# Patient Record
Sex: Female | Born: 1985 | Race: Black or African American | Hispanic: No | Marital: Single | Smoking: Current every day smoker
Health system: Southern US, Community
[De-identification: ages and names within clinical notes are randomized; demographics above are authoritative.]

## PROBLEM LIST (undated history)

## (undated) ENCOUNTER — Inpatient Hospital Stay (HOSPITAL_COMMUNITY): Payer: Self-pay

## (undated) DIAGNOSIS — I10 Essential (primary) hypertension: Secondary | ICD-10-CM

## (undated) DIAGNOSIS — O139 Gestational [pregnancy-induced] hypertension without significant proteinuria, unspecified trimester: Secondary | ICD-10-CM

## (undated) HISTORY — PX: NO PAST SURGERIES: SHX2092

---
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
## 2007-03-10 ENCOUNTER — Ambulatory Visit (HOSPITAL_COMMUNITY): Admission: RE | Admit: 2007-03-10 | Discharge: 2007-03-10 | Payer: Self-pay | Admitting: Obstetrics & Gynecology

## 2007-03-10 IMAGING — US US OB COMP +14 WK
1 series · 14 of 28 positions shown · non-contrast
Comparison: none

OBSTETRICAL ULTRASOUND:

 This ultrasound exam was performed in the [HOSPITAL] Ultrasound Department.  The OB US report was generated in the AS system, and faxed to the ordering physician.  This report is also available in [REDACTED] PACS.

[Series 1: us ob comp +14 wk · 0.23mm/px · 14 of 84 slices shown]
[im 4/84]
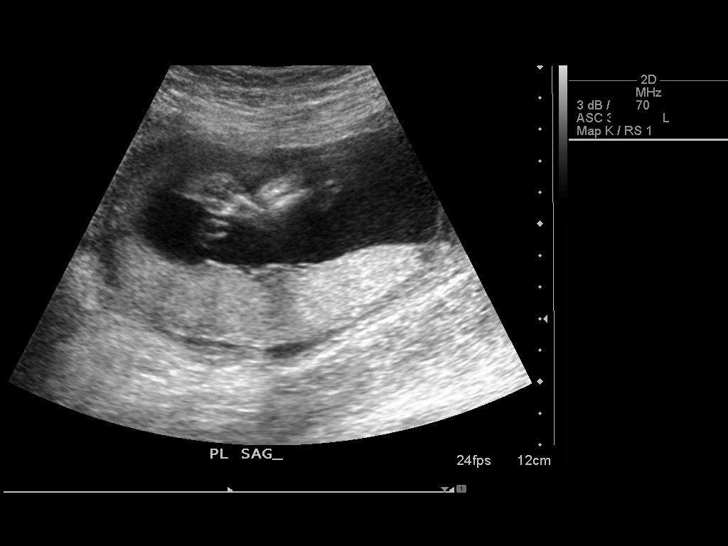
[im 10/84]
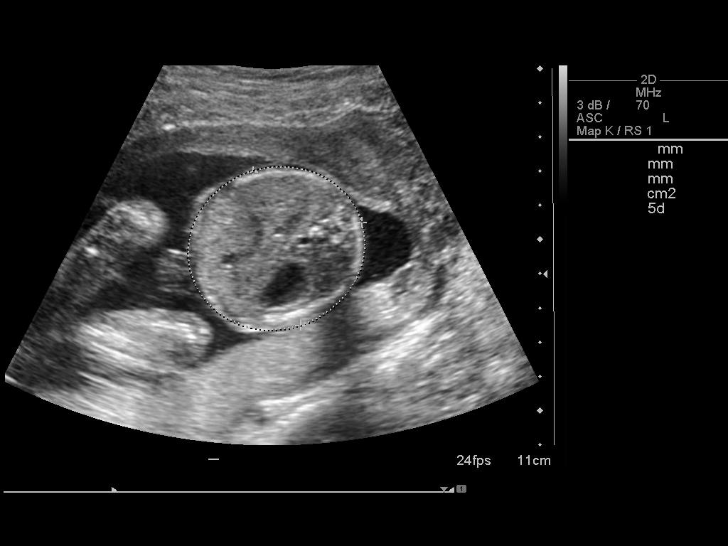
[im 16/84]
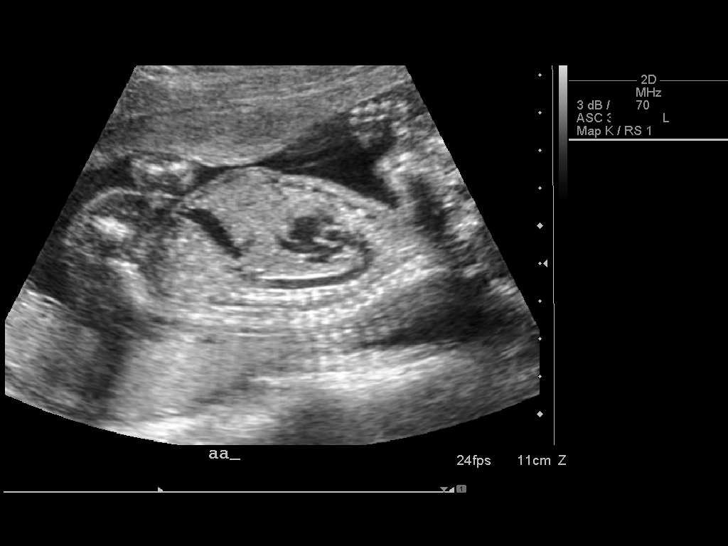
[im 22/84]
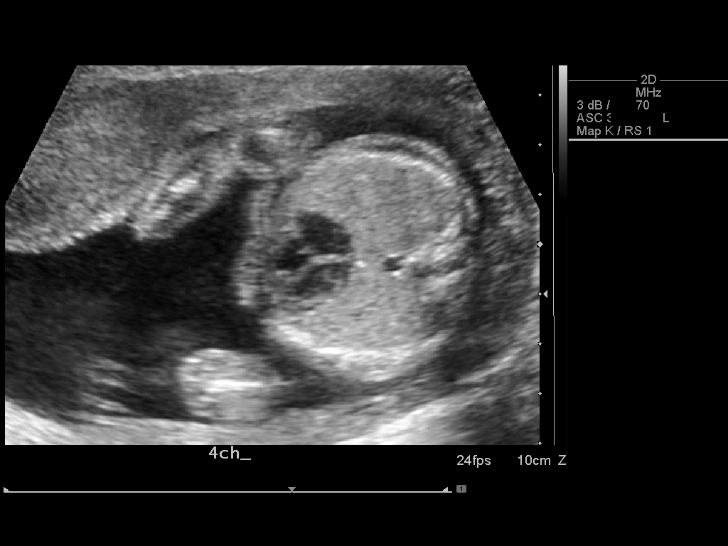
[im 28/84]
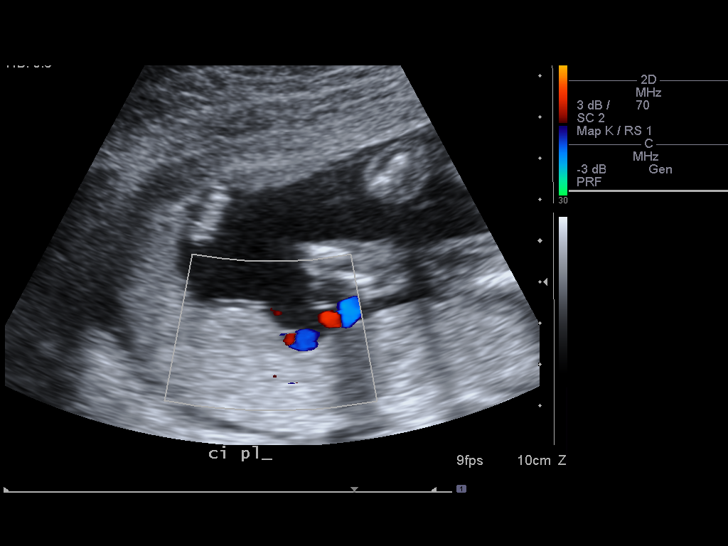
[im 34/84]
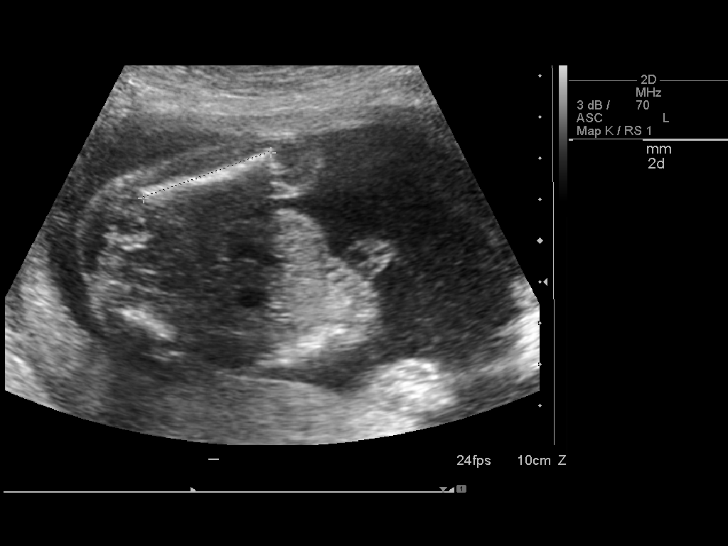
[im 40/84]
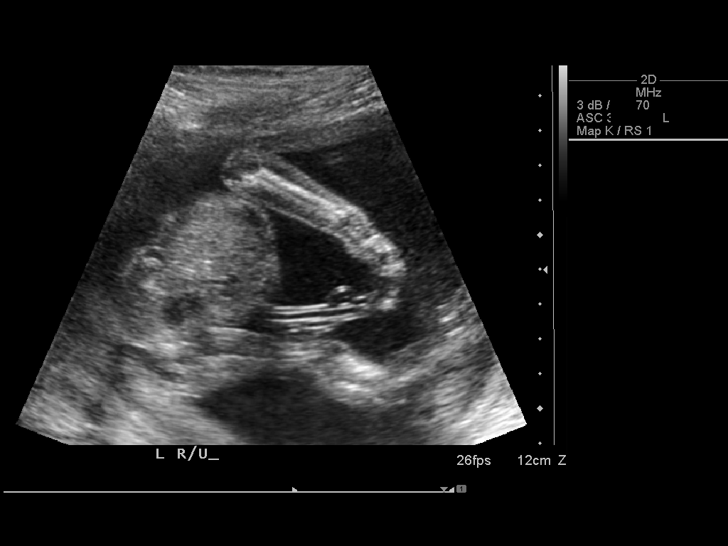
[im 47/84]
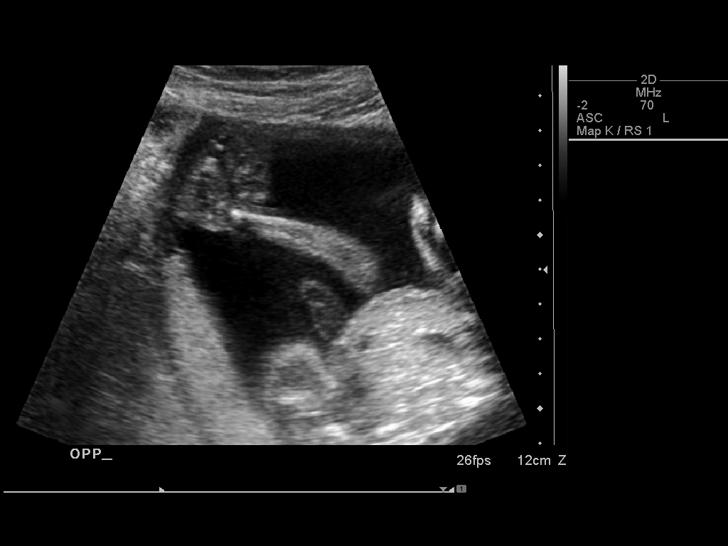
[im 53/84]
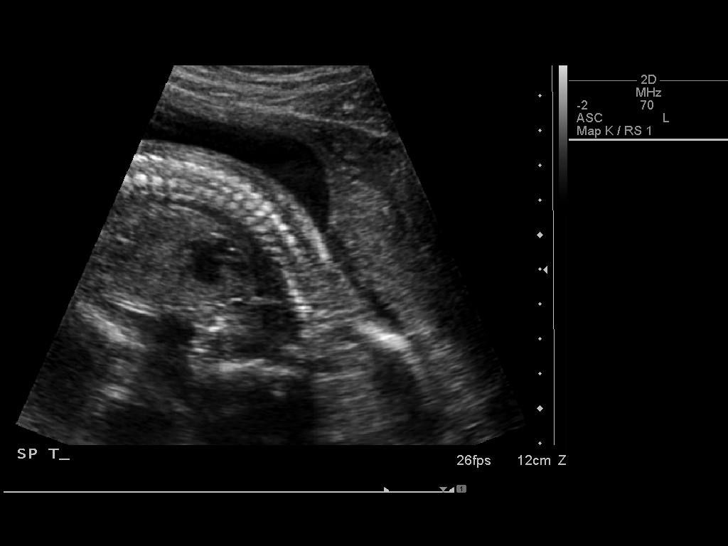
[im 59/84]
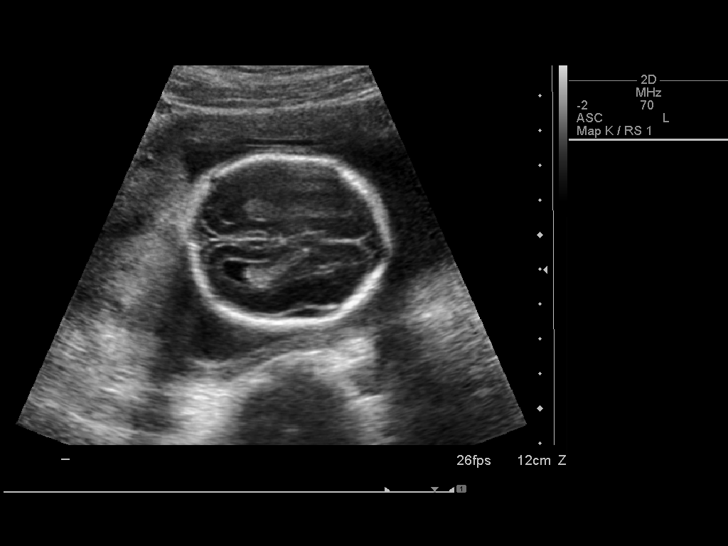
[im 65/84]
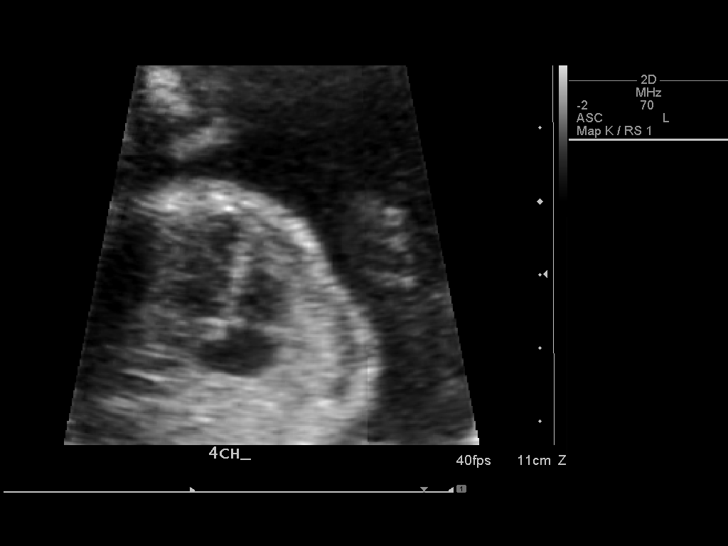
[im 71/84]
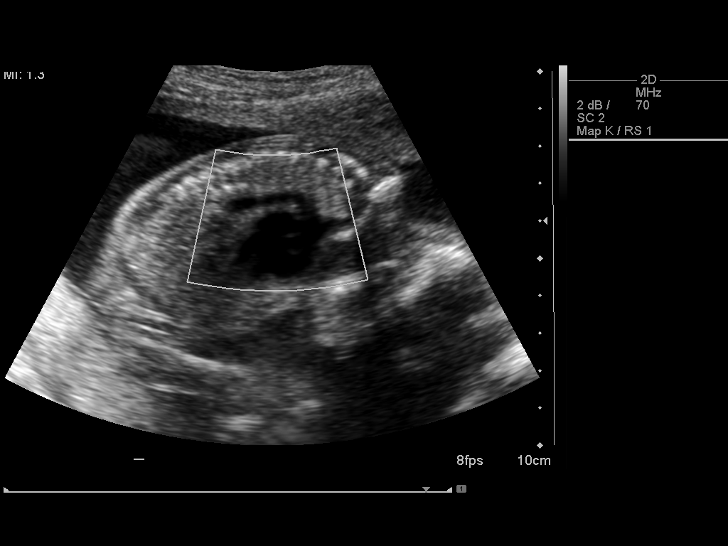
[im 77/84]
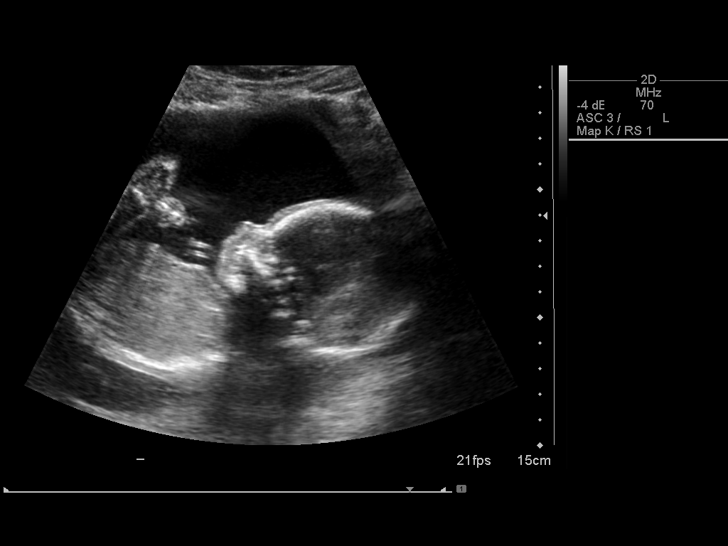
[im 84/84]
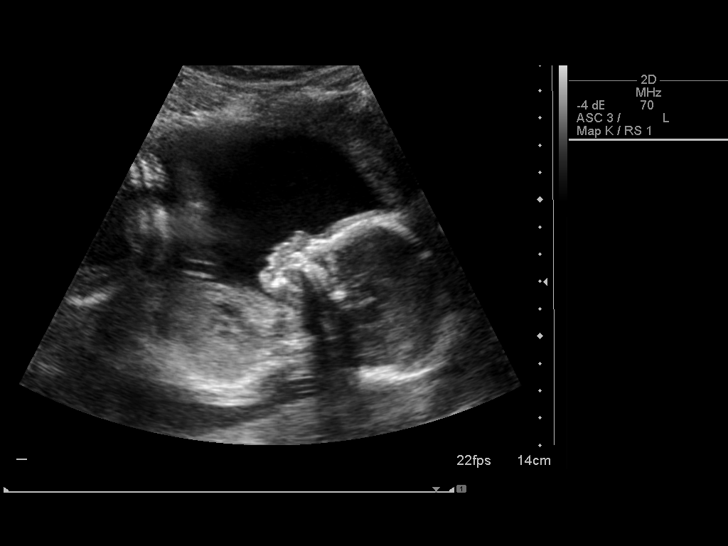

[14 of 28 positions shown; findings below may reference images not displayed]

IMPRESSION: See AS Obstetric US report.

## 2007-05-15 ENCOUNTER — Emergency Department (HOSPITAL_COMMUNITY): Admission: EM | Admit: 2007-05-15 | Discharge: 2007-05-16 | Payer: Self-pay | Admitting: Emergency Medicine

## 2007-06-27 ENCOUNTER — Ambulatory Visit (HOSPITAL_COMMUNITY): Admission: RE | Admit: 2007-06-27 | Discharge: 2007-06-27 | Payer: Self-pay | Admitting: Obstetrics

## 2007-06-27 IMAGING — US US OB FOLLOW-UP
1 series · 14 of 28 positions shown · non-contrast
Comparison: none

OBSTETRICAL ULTRASOUND:
 This ultrasound exam was performed in the [HOSPITAL] Ultrasound Department.  The OB US report was generated in the AS system, and faxed to the ordering physician.  This report is also available in [REDACTED] PACS.

[Series 1: us ob re-eval · 14 of 41 slices shown]
[im 2/41]
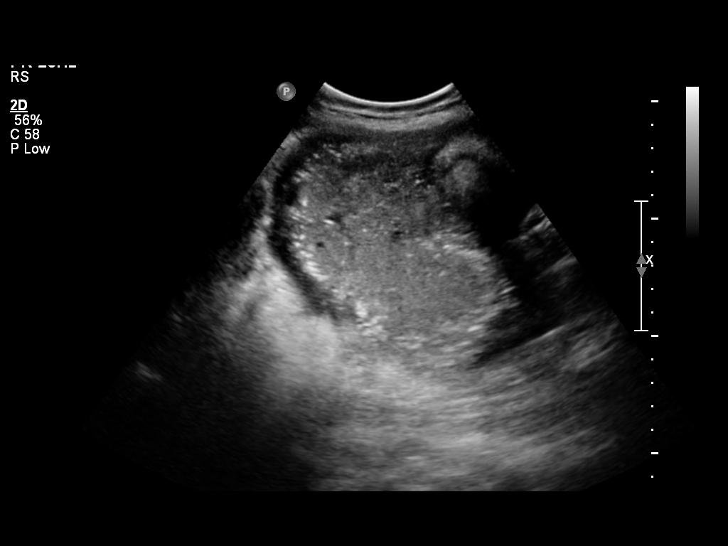
[im 5/41]
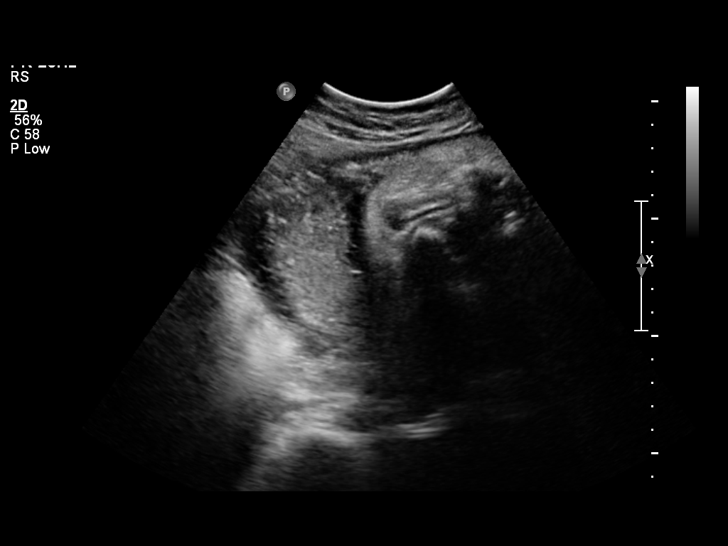
[im 8/41]
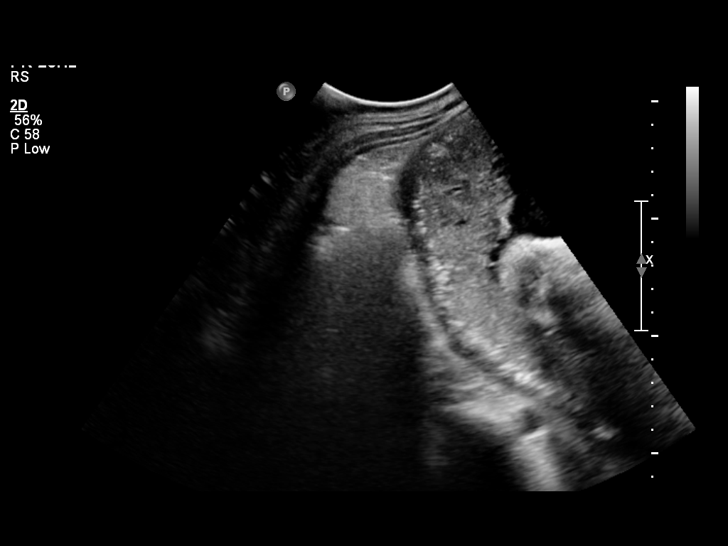
[im 11/41]
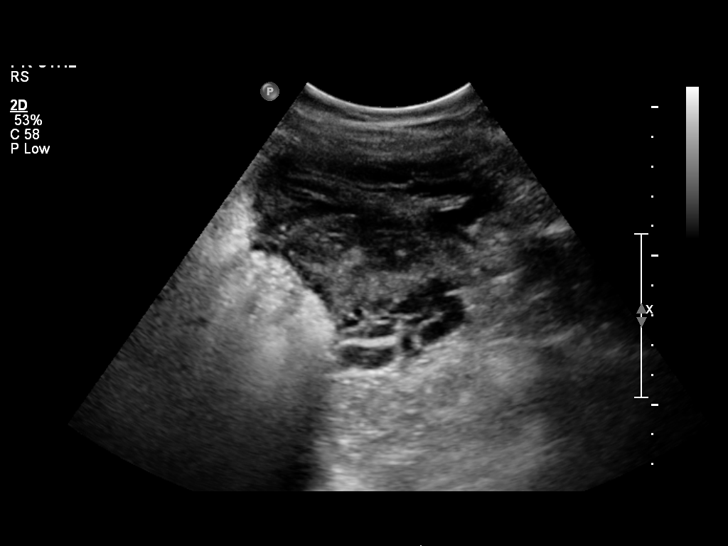
[im 14/41]
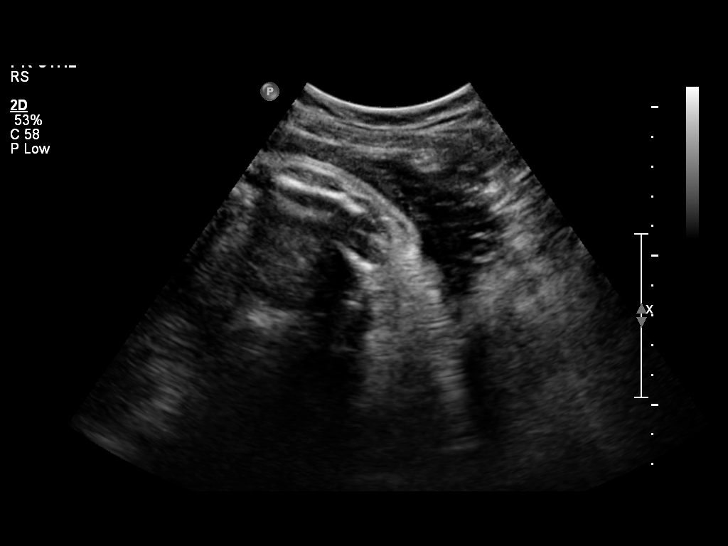
[im 17/41]
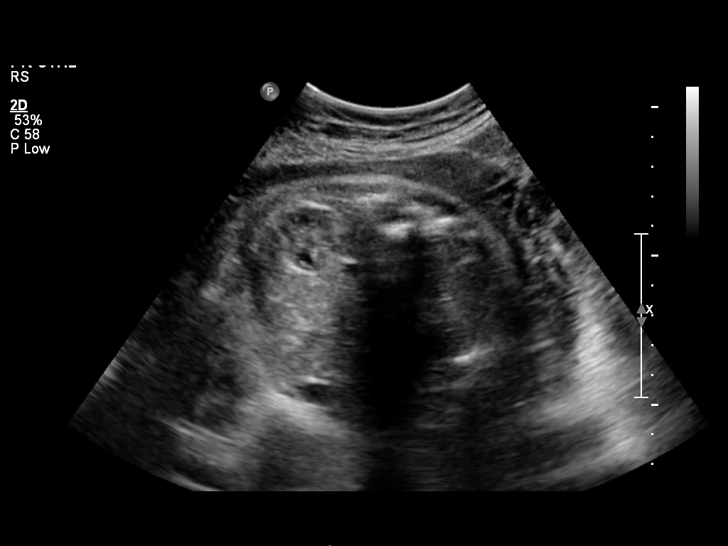
[im 20/41]
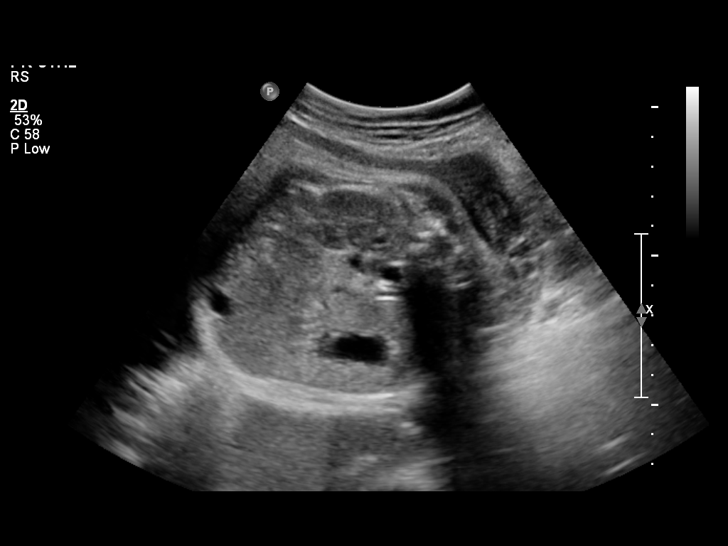
[im 23/41]
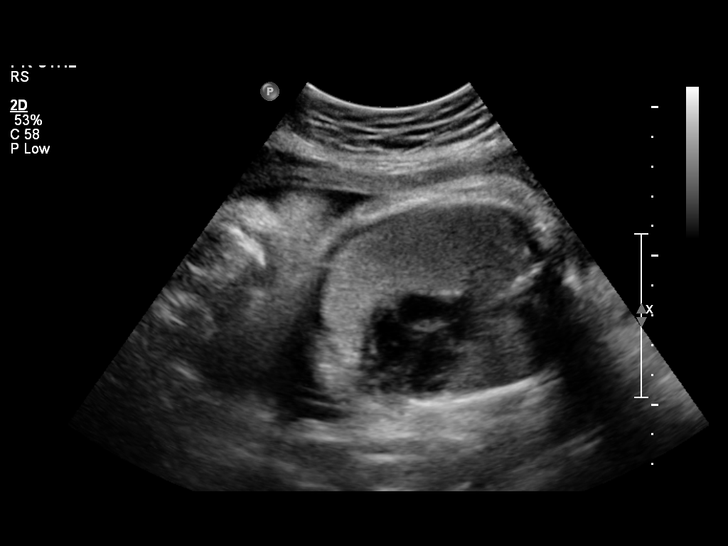
[im 26/41]
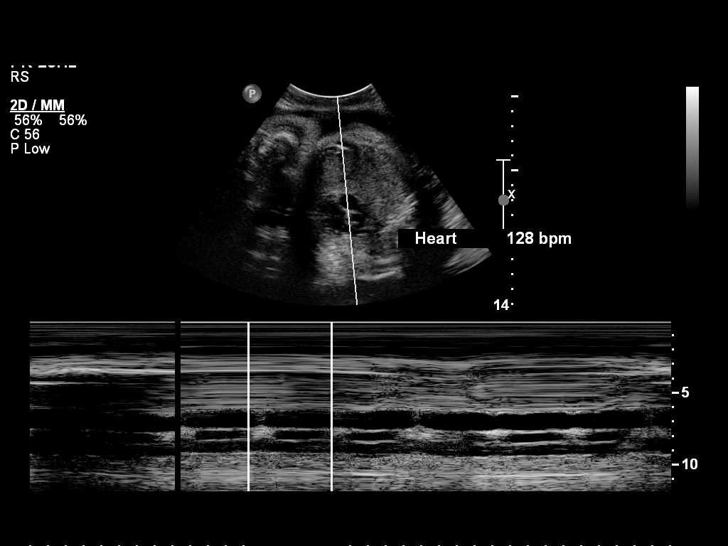
[im 29/41]
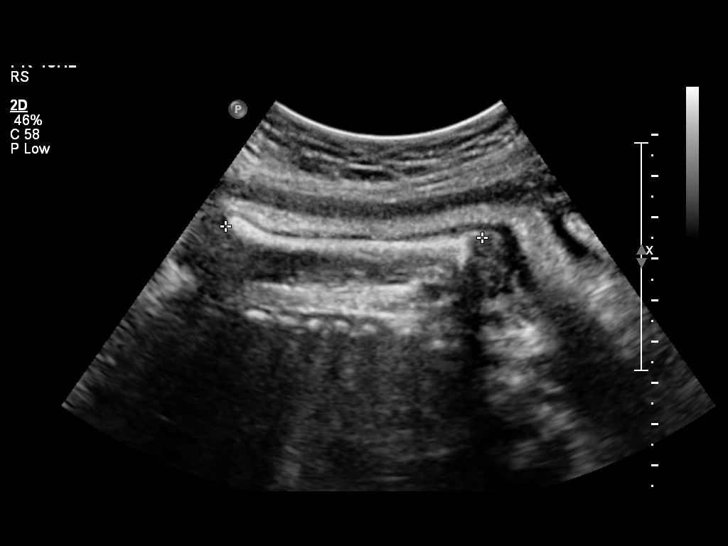
[im 32/41]
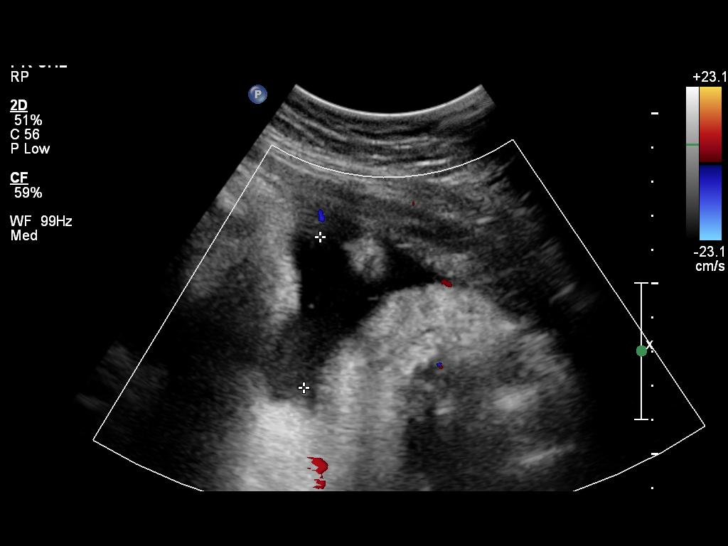
[im 35/41]
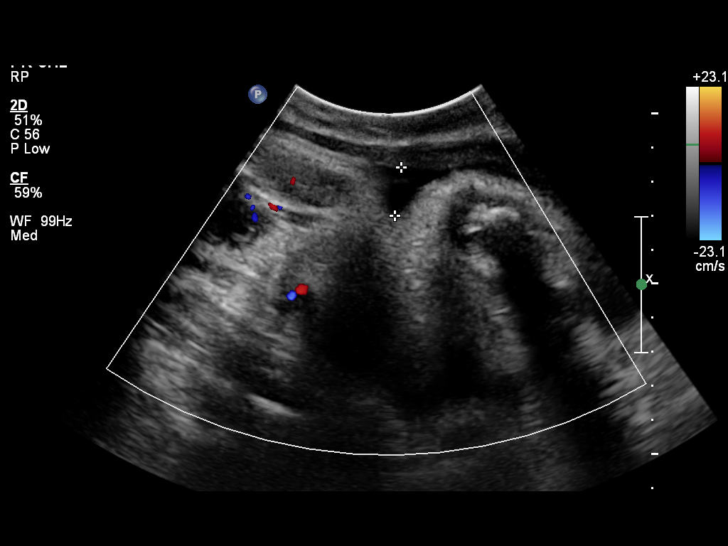
[im 38/41]
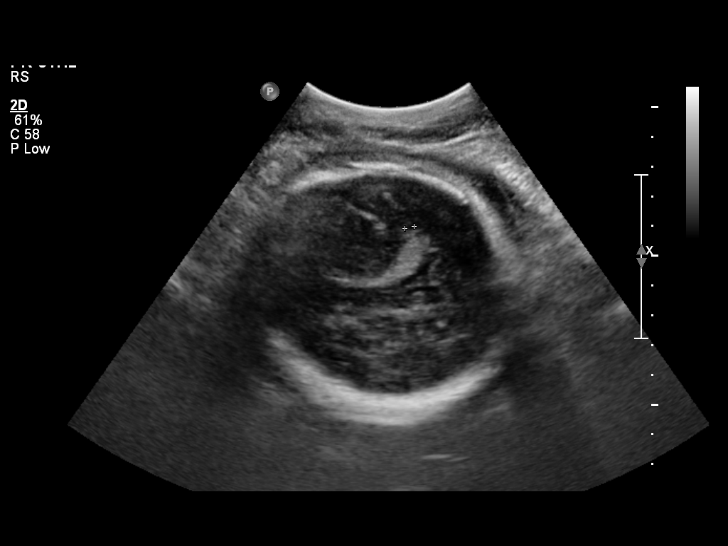
[im 41/41]
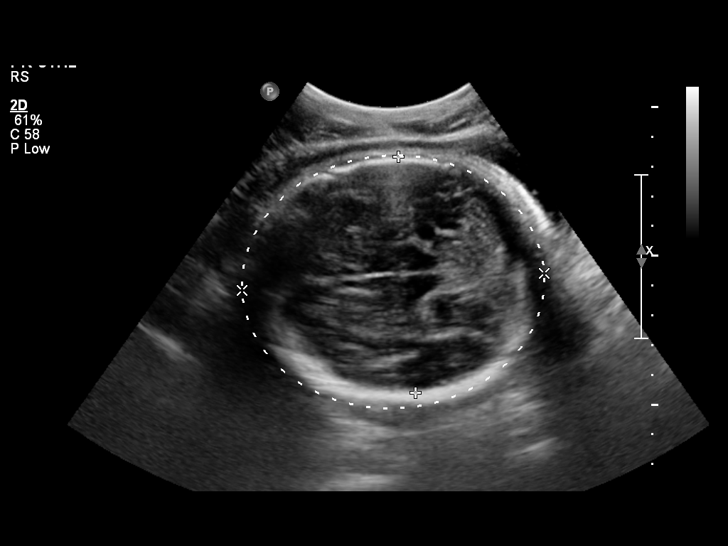

[14 of 28 positions shown; findings below may reference images not displayed]

IMPRESSION: See AS Obstetric US report.

## 2007-07-10 ENCOUNTER — Inpatient Hospital Stay (HOSPITAL_COMMUNITY): Admission: AD | Admit: 2007-07-10 | Discharge: 2007-07-13 | Payer: Self-pay | Admitting: Obstetrics

## 2007-07-11 ENCOUNTER — Encounter (INDEPENDENT_AMBULATORY_CARE_PROVIDER_SITE_OTHER): Payer: Self-pay | Admitting: Obstetrics

## 2008-06-02 ENCOUNTER — Emergency Department (HOSPITAL_COMMUNITY): Admission: EM | Admit: 2008-06-02 | Discharge: 2008-06-02 | Payer: Self-pay | Admitting: Emergency Medicine

## 2009-03-16 ENCOUNTER — Emergency Department (HOSPITAL_COMMUNITY): Admission: EM | Admit: 2009-03-16 | Discharge: 2009-03-16 | Payer: Self-pay | Admitting: Emergency Medicine

## 2009-08-02 ENCOUNTER — Emergency Department (HOSPITAL_COMMUNITY): Admission: EM | Admit: 2009-08-02 | Discharge: 2009-08-02 | Payer: Self-pay | Admitting: Emergency Medicine

## 2009-09-06 ENCOUNTER — Emergency Department (HOSPITAL_COMMUNITY): Admission: EM | Admit: 2009-09-06 | Discharge: 2009-09-06 | Payer: Self-pay | Admitting: Emergency Medicine

## 2010-02-12 ENCOUNTER — Ambulatory Visit (HOSPITAL_COMMUNITY)
Admission: RE | Admit: 2010-02-12 | Discharge: 2010-02-12 | Payer: Self-pay | Source: Home / Self Care | Attending: Obstetrics | Admitting: Obstetrics

## 2010-02-13 ENCOUNTER — Inpatient Hospital Stay (HOSPITAL_COMMUNITY)
Admission: AD | Admit: 2010-02-13 | Discharge: 2010-02-16 | Payer: Self-pay | Source: Home / Self Care | Attending: Obstetrics | Admitting: Obstetrics

## 2010-02-14 ENCOUNTER — Encounter (INDEPENDENT_AMBULATORY_CARE_PROVIDER_SITE_OTHER): Payer: Self-pay | Admitting: Obstetrics

## 2010-02-16 ENCOUNTER — Ambulatory Visit (HOSPITAL_COMMUNITY): Admission: RE | Admit: 2010-02-16 | Payer: Self-pay | Source: Home / Self Care | Admitting: Obstetrics

## 2010-02-16 LAB — COMPREHENSIVE METABOLIC PANEL
ALT: 18 U/L (ref 0–35)
AST: 25 U/L (ref 0–37)
Albumin: 2.8 g/dL — ABNORMAL LOW (ref 3.5–5.2)
Alkaline Phosphatase: 162 U/L — ABNORMAL HIGH (ref 39–117)
BUN: 3 mg/dL — ABNORMAL LOW (ref 6–23)
CO2: 25 mEq/L (ref 19–32)
Calcium: 8.6 mg/dL (ref 8.4–10.5)
Chloride: 105 mEq/L (ref 96–112)
Creatinine, Ser: 0.49 mg/dL (ref 0.4–1.2)
GFR calc Af Amer: >60 mL/min (ref >60)
GFR calc non Af Amer: >60 mL/min (ref >60)
Glucose, Bld: 76 mg/dL (ref 70–99)
Potassium: 2.9 mEq/L — ABNORMAL LOW (ref 3.5–5.1)
Sodium: 137 mEq/L (ref 135–145)
Total Bilirubin: 0.9 mg/dL (ref 0.3–1.2)
Total Protein: 6 g/dL (ref 6.0–8.3)

## 2010-02-16 LAB — URINALYSIS, ROUTINE W REFLEX MICROSCOPIC
Bilirubin Urine: NEGATIVE
Ketones, ur: NEGATIVE mg/dL
Leukocytes, UA: NEGATIVE
Nitrite: NEGATIVE
Protein, ur: 30 mg/dL — AB
Specific Gravity, Urine: 1.01 (ref 1.005–1.030)
Urine Glucose, Fasting: NEGATIVE mg/dL
Urobilinogen, UA: 0.2 mg/dL (ref 0.0–1.0)
pH: 7 (ref 5.0–8.0)

## 2010-02-16 LAB — URINE MICROSCOPIC-ADD ON

## 2010-02-16 LAB — CBC
HCT: 31.2 % — ABNORMAL LOW (ref 36.0–46.0)
Hemoglobin: 10.7 g/dL — ABNORMAL LOW (ref 12.0–15.0)
MCH: 29.8 pg (ref 26.0–34.0)
MCHC: 34.3 g/dL (ref 30.0–36.0)
MCV: 86.9 fL (ref 78.0–100.0)
Platelets: 195 10*3/uL (ref 150–400)
RBC: 3.59 MIL/uL — ABNORMAL LOW (ref 3.87–5.11)
RDW: 13.7 % (ref 11.5–15.5)
WBC: 7.6 10*3/uL (ref 4.0–10.5)

## 2010-02-16 LAB — URIC ACID: Uric Acid, Serum: 4.3 mg/dL (ref 2.4–7.0)

## 2010-02-16 LAB — LACTATE DEHYDROGENASE: LDH: 168 U/L (ref 94–250)

## 2010-02-17 LAB — CBC
HCT: 34.8 % — ABNORMAL LOW (ref 36.0–46.0)
HCT: 31.2 % — ABNORMAL LOW (ref 36.0–46.0)
Hemoglobin: 11.9 g/dL — ABNORMAL LOW (ref 12.0–15.0)
Hemoglobin: 10.7 g/dL — ABNORMAL LOW (ref 12.0–15.0)
MCH: 29.8 pg (ref 26.0–34.0)
MCH: 30.1 pg (ref 26.0–34.0)
MCHC: 34.2 g/dL (ref 30.0–36.0)
MCHC: 34.3 g/dL (ref 30.0–36.0)
MCV: 87 fL (ref 78.0–100.0)
MCV: 87.6 fL (ref 78.0–100.0)
Platelets: 225 10*3/uL (ref 150–400)
Platelets: 205 10*3/uL (ref 150–400)
RBC: 4 MIL/uL (ref 3.87–5.11)
RBC: 3.56 MIL/uL — ABNORMAL LOW (ref 3.87–5.11)
RDW: 13.7 % (ref 11.5–15.5)
RDW: 14.2 % (ref 11.5–15.5)
WBC: 12 10*3/uL — ABNORMAL HIGH (ref 4.0–10.5)
WBC: 11.9 10*3/uL — ABNORMAL HIGH (ref 4.0–10.5)

## 2010-02-17 LAB — MRSA PCR SCREENING: MRSA by PCR: NEGATIVE

## 2010-02-17 LAB — RPR: RPR Ser Ql: NONREACTIVE

## 2010-02-18 NOTE — H&P (Addendum)
  NAMERAISSA, DAM NO.:  [id number]  MEDICAL RECORD NO.:  [id number]          PATIENT TYPE:  INP  LOCATION:  9374                          FACILITY:  [facility]  PHYSICIAN:  Roseanna Rainbow, M.D.DATE OF BIRTH:  [birth year]  DATE OF ADMISSION:  02/13/2010 DATE OF DISCHARGE:                             HISTORY & PHYSICAL   CHIEF COMPLAINT:  The patient is a 25 year old para 1, with an estimated date of confinement of February 21, 2010, with a headache and elevated blood pressures.  HISTORY OF PRESENT ILLNESS:  [facility]  One day prior to presentation, she reports undergoing a nonstress test.  Blood pressures at that point per the patient were in the 130s/90s range.  The patient has been monitoring her blood pressures at home and diastolics have been over 100s.  She also complains of a headache, severity 8/10.  ALLERGIES:  No known drug allergies.  PAST GYN HISTORY:  Normal triad.  PAST OB HISTORY:  She was delivered of a live born female in 2009, birth weight 5 pounds and 6 ounces at 38 weeks, labor was induced for pregnancy-induced hypertension.  PAST MEDICAL HISTORY:  Asthma.  PAST SURGICAL HISTORY:  She denies.  FAMILY HISTORY:  Noncontributory.  SOCIAL HISTORY:  No tobacco, ethanol, or drug use.  ANTEPARTUM COURSE:  Prenatal care with Dr. Gaynell Face, onset of care at 14- 16 weeks.  PRENATAL LABS:  Hemoglobin 13.1, hematocrit 38.4, platelets 331,000. Blood type is O positive, antibody screen negative, sickle cell trait negative, RPR nonreactive, rubella immune, hepatitis B surface antigen negative, HIV nonreactive, 1-hour GTT 135, GBS negative on January 12, 2010.  Ultrasound on September 12, 2009 at 16 weeks and 6 days dates of pregnancy, [facility] February 21, 2010, anterior placenta, no previa.  REVIEW OF SYMPTOMS:  NEUROLOGIC:  Please see the above.  MEDICATIONS:  Please see the medication reconciliation  form.  PHYSICAL EXAMINATION:  VITAL SIGNS:  Blood pressure 140s to 160s over 80s to 110s. GENERAL:  Moderate distress. ABDOMEN:  Fetal heart tracing baseline 120s to 130s, good long-term variability, accelerations noted, no decelerations.  Tocodynamometer, rare uterine contractions. GU:  Sterile vaginal exam per the mid-level provider.  LABS:  Uric acid 4.3.  LDH 168.  CMET normal.  Urinalysis, trace protein.  Hemoglobin 10.7, platelets 195,000.  ASSESSMENT: 1. Intrauterine pregnancy at 38 plus weeks. 2. Pregnancy-induced hypertension by blood pressure criteria and     neurologic symptoms. 3. Category 1 fetal heart tracing. 4. Unfavorable Bishop score.  PLAN:  Admission, 2-stage induction of labor, magnesium sulfate for seizure prophylaxis.  We are going to add labetalol as needed, may need to start oral antihypertensive agents.     Roseanna Rainbow, M.D.     Judee Clara  D:  02/14/2010  T:  02/14/2010  Job:  [id number]  cc:   Kathreen Cosier, M.D. Fax: [phone]  Electronically Signed by Antionette Char M.D. on 02/18/2010 09:18:07 PM

## 2010-02-25 ENCOUNTER — Encounter: Payer: Self-pay | Admitting: Obstetrics

## 2010-03-23 ENCOUNTER — Emergency Department (HOSPITAL_COMMUNITY)
Admission: EM | Admit: 2010-03-23 | Discharge: 2010-03-24 | Discharge status: Against Medical Advice | Payer: Medicaid Other | Attending: Emergency Medicine | Admitting: Emergency Medicine

## 2010-03-23 DIAGNOSIS — R51 Headache: Secondary | ICD-10-CM | POA: Insufficient documentation

## 2010-04-10 LAB — RAPID STREP SCREEN (MED CTR MEBANE ONLY): Streptococcus, Group A Screen (Direct): NEGATIVE

## 2010-10-22 LAB — COMPREHENSIVE METABOLIC PANEL
ALT: 10
ALT: 13
AST: 17
AST: 20
Albumin: 2.1 — ABNORMAL LOW
Albumin: 2.9 — ABNORMAL LOW
Alkaline Phosphatase: 101
Alkaline Phosphatase: 154 — ABNORMAL HIGH
BUN: 2 — ABNORMAL LOW
BUN: 3 — ABNORMAL LOW
CO2: 23
CO2: 27
Calcium: 7.3 — ABNORMAL LOW
Calcium: 8.8
Chloride: 103
Chloride: 104
Creatinine, Ser: 0.39 — ABNORMAL LOW
Creatinine, Ser: 0.54
GFR calc Af Amer: >60
GFR calc Af Amer: >60
GFR calc non Af Amer: >60
GFR calc non Af Amer: >60
Glucose, Bld: 67 — ABNORMAL LOW
Glucose, Bld: 94
Potassium: 2.7 — CL
Potassium: 3.5
Sodium: 135
Sodium: 137
Total Bilirubin: 0.8
Total Bilirubin: 1.1
Total Protein: 5.1 — ABNORMAL LOW
Total Protein: 6.2

## 2010-10-22 LAB — CBC
HCT: 32 — ABNORMAL LOW
HCT: 37.4
Hemoglobin: 11 — ABNORMAL LOW
Hemoglobin: 13
MCHC: 34.3
MCHC: 34.7
MCV: 92.7
MCV: 94
Platelets: 218
Platelets: 249
RBC: 3.41 — ABNORMAL LOW
RBC: 4.03
RDW: 13.6
RDW: 13.6
WBC: 13.7 — ABNORMAL HIGH
WBC: 7.5

## 2010-10-22 LAB — RPR: RPR Ser Ql: NONREACTIVE

## 2010-10-22 LAB — LACTATE DEHYDROGENASE
LDH: 158
LDH: 184

## 2010-10-22 LAB — MAGNESIUM: Magnesium: 4.6 — ABNORMAL HIGH

## 2010-10-22 LAB — URIC ACID: Uric Acid, Serum: 4.6

## 2011-01-26 ENCOUNTER — Emergency Department (INDEPENDENT_AMBULATORY_CARE_PROVIDER_SITE_OTHER)
Admission: EM | Admit: 2011-01-26 | Discharge: 2011-01-26 | Disposition: A | Discharge status: Home / Self Care | Payer: PRIVATE HEALTH INSURANCE | Source: Home / Self Care | Attending: Emergency Medicine | Admitting: Emergency Medicine

## 2011-01-26 ENCOUNTER — Encounter: Payer: Self-pay | Admitting: *Deleted

## 2011-01-26 DIAGNOSIS — L0291 Cutaneous abscess, unspecified: Secondary | ICD-10-CM

## 2011-01-26 DIAGNOSIS — L0390 Cellulitis, unspecified: Secondary | ICD-10-CM

## 2011-01-26 HISTORY — DX: Essential (primary) hypertension: I10

## 2011-01-26 MED ORDER — SULFAMETHOXAZOLE-TMP DS 800-160 MG PO TABS: 2.0000 | ORAL_TABLET | Freq: Two times a day (BID) | ORAL | Status: AC

## 2011-01-26 NOTE — ED Provider Notes (Signed)
History     CSN: [id number]  Arrival date & time 01/26/11  0930   First MD Initiated Contact with Patient 01/26/11 1007      Chief Complaint  Patient presents with  . Sore    (Consider location/radiation/quality/duration/timing/severity/associated sxs/prior treatment) HPI Comments: Rachel Patel has had a two-day history of a painful bump on her left labia majora. This has not been draining. She denies any fever or chills. She has not had any other abscesses or skin infections and denies any other skin lesions elsewhere and she has no history of MRSA or diabetes.   Past Medical History  Diagnosis Date  . Asthma     during pregnancy  . Hypertension     during pregnancy    History reviewed. No pertinent past surgical history.  History reviewed. No pertinent family history.  History  Substance Use Topics  . Smoking status: Current Everyday Smoker  . Smokeless tobacco: Not on file  . Alcohol Use: No    OB History    Grav Para Term Preterm Abortions TAB SAB Ect Mult Living                  Review of Systems  Constitutional: Negative for fever and chills.  Skin: Negative for color change, pallor, rash and wound.    Allergies  Review of patient's allergies indicates no known allergies.  Home Medications   Current Outpatient Rx  Name Route Sig Dispense Refill  . SULFAMETHOXAZOLE-TMP DS 800-160 MG PO TABS Oral Take 2 tablets by mouth 2 (two) times daily. 40 tablet 0    BP 122/76  Pulse 78  Temp(Src) 98.2 F (36.8 C) (Oral)  Resp 16  SpO2 100%  LMP 01/25/2011  Physical Exam  Nursing note and vitals reviewed. Constitutional: She appears well-developed and well-nourished. No distress.  Genitourinary:       On pelvic exam, she has a 1 cm, swollen, tender, fluctuant nodule at the posterior and of the left labia majora. I did not attempt to do a speculum or bimanual examination due to the patient's discomfort.  Skin: Skin is warm and dry. No abrasion, no bruising, no  ecchymosis, no lesion and no rash noted. She is not diaphoretic. No erythema. No pallor.    ED Course  INCISION AND DRAINAGE Date/Time: 01/26/2011 5:08 PM Performed by: Roque Lias Authorized by: Roque Lias Consent: Verbal consent obtained. Written consent not obtained. Risks and benefits: risks, benefits and alternatives were discussed Consent given by: patient Patient understanding: patient states understanding of the procedure being performed Patient identity confirmed: verbally with patient and arm band Type: abscess Body area: anogenital Location details: vulva Anesthesia: local infiltration Local anesthetic: lidocaine 2% with epinephrine Anesthetic total: 2 ml Patient sedated: no Scalpel size: 11 Incision type: single straight Complexity: simple Drainage: purulent Drainage amount: moderate Wound treatment: drain placed Packing material: 1/4 in iodoform gauze Patient tolerance: Patient tolerated the procedure well with no immediate complications.   (including critical care time)   Labs Reviewed  CULTURE, ROUTINE-ABSCESS   No results found.   1. Abscess       MDM  She has an abscess of the left labia majora. This was cultured. She was begun on Septra DS, 2 tablets twice daily for 10 days. She was given instructions on care of the area at home.        Roque Lias, MD 01/26/11 1710

## 2011-01-26 NOTE — ED Notes (Signed)
Pt is here with complaints of suspected vaginal abcess.  Pt reports large, hard lump on left labia.  Onset yesterday.

## 2011-01-29 LAB — CULTURE, ROUTINE-ABSCESS

## 2011-09-30 ENCOUNTER — Encounter (HOSPITAL_COMMUNITY): Payer: Self-pay | Admitting: Emergency Medicine

## 2011-09-30 DIAGNOSIS — Z885 Allergy status to narcotic agent status: Secondary | ICD-10-CM | POA: Insufficient documentation

## 2011-09-30 DIAGNOSIS — O99330 Smoking (tobacco) complicating pregnancy, unspecified trimester: Secondary | ICD-10-CM | POA: Insufficient documentation

## 2011-09-30 DIAGNOSIS — M259 Joint disorder, unspecified: Secondary | ICD-10-CM | POA: Insufficient documentation

## 2011-09-30 DIAGNOSIS — M549 Dorsalgia, unspecified: Secondary | ICD-10-CM | POA: Insufficient documentation

## 2011-09-30 DIAGNOSIS — M899 Disorder of bone, unspecified: Secondary | ICD-10-CM | POA: Insufficient documentation

## 2011-09-30 NOTE — ED Notes (Signed)
Pt reports having lower back pain, hip pain, cramping in abd--all started tonight; pt reports she is pregnant but unsure how far along, approx. 4 months; pt does report feeling baby moving; denies vaginal bleeding; took tylenol with no relief

## 2011-10-01 ENCOUNTER — Emergency Department (HOSPITAL_COMMUNITY)
Admission: EM | Admit: 2011-10-01 | Discharge: 2011-10-01 | Disposition: A | Discharge status: Home / Self Care | Payer: Medicaid Other | Attending: Emergency Medicine | Admitting: Emergency Medicine

## 2011-10-01 DIAGNOSIS — M549 Dorsalgia, unspecified: Secondary | ICD-10-CM

## 2011-10-01 LAB — POCT PREGNANCY, URINE: Preg Test, Ur: POSITIVE — AB

## 2011-10-01 LAB — URINALYSIS, ROUTINE W REFLEX MICROSCOPIC
Leukocytes, UA: NEGATIVE
Protein, ur: NEGATIVE mg/dL
Urobilinogen, UA: 0.2 mg/dL (ref 0.0–1.0)

## 2011-10-01 MED ORDER — PRENATAL 19 PO TABS: 1.0000 | ORAL_TABLET | Freq: Every day | ORAL | Status: DC

## 2011-10-01 MED ORDER — ACETAMINOPHEN 325 MG PO TABS
650.0000 mg | ORAL_TABLET | Freq: Once | ORAL | Status: AC
  Administered 2011-10-01: 650 mg via ORAL
  Filled 2011-10-01: qty 2

## 2011-10-01 NOTE — ED Notes (Signed)
Patient was given discharge instructions and released from the ed in stable condition.

## 2011-10-01 NOTE — ED Notes (Signed)
[place] at bedside per Dr. Juliette Mangle request

## 2011-10-01 NOTE — ED Provider Notes (Signed)
History     CSN: [id number]  Arrival date & time 09/30/11  2339   First MD Initiated Contact with Patient 10/01/11 0133      Chief Complaint  Patient presents with  . Abdominal Cramping    (Consider location/radiation/quality/duration/timing/severity/associated sxs/prior treatment) HPI LMP MAy.  G3P2, pos preg test at home. Not scheduled to see OB GYN until she gets medicaid card which is pending. Has not started PNVs, having lower back pains, works on her feet all day, no dysuria, urgency or frequency. No vag bleeding or LOF, is feeling baby movng. Prior preg complicated by pre-eclampsia. No F/C, no N/V. Pain mild in severity and currently not pregnant, no trauma, no radiation of pain, no LE weakness or numbness. Location lower back/ has not taken anything for this.   Past Medical History  Diagnosis Date  . Asthma     during pregnancy  . Hypertension     during pregnancy    History reviewed. No pertinent past surgical history.  History reviewed. No pertinent family history.  History  Substance Use Topics  . Smoking status: Current Everyday Smoker -- 0.2 packs/day  . Smokeless tobacco: Not on file  . Alcohol Use: No    OB History    Grav Para Term Preterm Abortions TAB SAB Ect Mult Living   1               Review of Systems  Constitutional: Negative for fever and chills.  HENT: Negative for neck pain and neck stiffness.   Eyes: Negative for pain.  Respiratory: Negative for shortness of breath.   Cardiovascular: Negative for chest pain.  Gastrointestinal: Negative for abdominal pain.  Genitourinary: Negative for dysuria.  Musculoskeletal: Positive for back pain.  Skin: Negative for rash.  Neurological: Negative for headaches.  All other systems reviewed and are negative.    Allergies  Codeine  Home Medications   Current Outpatient Rx  Name Route Sig Dispense Refill  . ACETAMINOPHEN 500 MG PO TABS Oral Take 1,000 mg by mouth every 6 (six) hours as needed.  For pain      BP 102/58  Pulse 81  Temp 98.8 F (37.1 C) (Oral)  Resp 18  SpO2 100%  LMP 06/14/2011  Physical Exam  Constitutional: She is oriented to person, place, and time. She appears well-developed and well-nourished.  HENT:  Head: Normocephalic and atraumatic.  Eyes: Conjunctivae and EOM are normal. Pupils are equal, round, and reactive to light.  Neck: Trachea normal. Neck supple. No thyromegaly present.  Cardiovascular: Normal rate, regular rhythm, S1 normal, S2 normal and normal pulses.     No systolic murmur is present   No diastolic murmur is present  Pulses:      Radial pulses are 2+ on the right side, and 2+ on the left side.  Pulmonary/Chest: Effort normal and breath sounds normal. She has no wheezes. She has no rhonchi. She has no rales. She exhibits no tenderness.  Abdominal: Soft. Normal appearance and bowel sounds are normal. There is no rebound, no guarding, no CVA tenderness and negative Murphy's sign.       gravid  Musculoskeletal:       Mild lumbar and paralumbar tenderness no deformity. BLE:s no deficits, Calves nontender, no cords or erythema, negative Homans sign  Neurological: She is alert and oriented to person, place, and time. She has normal strength. No cranial nerve deficit or sensory deficit. GCS eye subscore is 4. GCS verbal subscore is 5. GCS motor subscore  is 6.  Skin: Skin is warm and dry. No rash noted. She is not diaphoretic.  Psychiatric: Her speech is normal.       Cooperative and appropriate    ED Course  [place] bedside Date/Time: 10/01/2011 2:17 AM Performed by: Sunnie Nielsen Authorized by: Sunnie Nielsen Consent: Verbal consent obtained. Risks and benefits: risks, benefits and alternatives were discussed Consent given by: patient Patient understanding: patient states understanding of the procedure being performed Patient consent: the patient's understanding of the procedure matches consent given Procedure consent: procedure consent matches  procedure scheduled Required items: required blood products, implants, devices, and special equipment available Patient identity confirmed: verbally with patient Time out: Immediately prior to procedure a "time out" was called to verify the correct patient, procedure, equipment, support staff and site/side marked as required. Preparation: Patient was prepped and draped in the usual sterile fashion. Patient tolerance: Patient tolerated the procedure well with no immediate complications. Comments: Transabdominal US demonstrates IUP with BPD [redacted]w[redacted]d and FHR 167   (including critical care time)  Results for orders placed during the hospital encounter of 10/01/11  URINALYSIS, ROUTINE W REFLEX MICROSCOPIC      Component Value Range   Color, Urine YELLOW  YELLOW   APPearance CLOUDY (*) CLEAR   Specific Gravity, Urine 1.040 (*) 1.005 - 1.030   pH 6.0  5.0 - 8.0   Glucose, UA NEGATIVE  NEGATIVE mg/dL   Hgb urine dipstick NEGATIVE  NEGATIVE   Bilirubin Urine NEGATIVE  NEGATIVE   Ketones, ur NEGATIVE  NEGATIVE mg/dL   Protein, ur NEGATIVE  NEGATIVE mg/dL   Urobilinogen, UA 0.2  0.0 - 1.0 mg/dL   Nitrite NEGATIVE  NEGATIVE   Leukocytes, UA NEGATIVE  NEGATIVE  POCT PREGNANCY, URINE      Component Value Range   Preg Test, Ur POSITIVE (*) NEGATIVE   Ice, tylenol  RX PNVs, PT plans to see Dr Gaynell Face who she saw in the past. Stable for d/c home/. precautions verbalized as understood.   UA reviewed no UTI.  MDM   VS and nursing notes reviewed. Medication provided. [place] as above. Plan f/u OB GYN and start PNVs, tylenol and ice and rest for back pain.      Sunnie Nielsen, MD 10/01/11 [phone]

## 2011-10-01 NOTE — ED Notes (Signed)
Patient reports being about [redacted] weeks pregnant. No reports of abnormal vaginal discharge. Reports right flank pain started while at work at [employer].

## 2011-10-16 ENCOUNTER — Encounter (HOSPITAL_COMMUNITY): Payer: Self-pay | Admitting: *Deleted

## 2011-10-16 ENCOUNTER — Inpatient Hospital Stay (HOSPITAL_COMMUNITY)
Admission: AD | Admit: 2011-10-16 | Discharge: 2011-10-16 | Disposition: A | Discharge status: Home / Self Care | Payer: 59 | Source: Ambulatory Visit | Attending: Obstetrics | Admitting: Obstetrics

## 2011-10-16 DIAGNOSIS — L293 Anogenital pruritus, unspecified: Secondary | ICD-10-CM | POA: Insufficient documentation

## 2011-10-16 DIAGNOSIS — O2390 Unspecified genitourinary tract infection in pregnancy, unspecified trimester: Secondary | ICD-10-CM | POA: Insufficient documentation

## 2011-10-16 DIAGNOSIS — B3731 Acute candidiasis of vulva and vagina: Secondary | ICD-10-CM | POA: Insufficient documentation

## 2011-10-16 DIAGNOSIS — B373 Candidiasis of vulva and vagina: Secondary | ICD-10-CM

## 2011-10-16 LAB — URINALYSIS, ROUTINE W REFLEX MICROSCOPIC
Bilirubin Urine: NEGATIVE
Ketones, ur: NEGATIVE mg/dL
Nitrite: NEGATIVE
Urobilinogen, UA: 0.2 mg/dL (ref 0.0–1.0)

## 2011-10-16 LAB — URINE MICROSCOPIC-ADD ON

## 2011-10-16 LAB — WET PREP, GENITAL

## 2011-10-16 MED ORDER — TERCONAZOLE 0.4 % VA CREA: 1.0000 | TOPICAL_CREAM | Freq: Every day | VAGINAL | Status: DC

## 2011-10-16 NOTE — MAU Note (Signed)
Onset of vaginal itching since yesterday obtained [employer] brand itch relief cream helped initially now has vaginal irritation and discomfort, no vaginal discharge.

## 2011-10-16 NOTE — MAU Provider Note (Signed)
History     CSN: [id number]  Arrival date and time: 10/16/11 2159   None     Chief Complaint  Patient presents with  . Vaginal Itching  . Vaginal Pain   HPI This is a 26 y.o. female at [redacted]w[redacted]d who presents with c/o vaginal itching and burning for several days. Tried some monistat but only used it externally. No bleeding or pain.   OB History    Grav Para Term Preterm Abortions TAB SAB Ect Mult Living   4 2 2  1 1    2       Past Medical History  Diagnosis Date  . Asthma     during pregnancy  . Hypertension     during pregnancy    Past Surgical History  Procedure Date  . No past surgeries     Family History  Problem Relation Age of Onset  . Other Neg Hx     History  Substance Use Topics  . Smoking status: Current Every Day Smoker -- 0.2 packs/day  . Smokeless tobacco: Not on file  . Alcohol Use: No    Allergies:  Allergies  Allergen Reactions  . Codeine Anaphylaxis and Swelling    Prescriptions prior to admission  Medication Sig Dispense Refill  . acetaminophen (TYLENOL) 500 MG tablet Take 1,000 mg by mouth every 6 (six) hours as needed. For pain      . miconazole (MONISTAT-3) KIT Place 1 each vaginally at bedtime.      . Prenatal Vit-DSS-Fe Fum-FA (PRENATAL 19) tablet Take 1 tablet by mouth daily.  30 tablet  0    ROS See HPI  Physical Exam   Blood pressure 108/64, pulse 97, temperature 98.1 F (36.7 C), temperature source Oral, resp. rate 16, height 4\' 10"  (1.473 m), weight 131 lb (59.421 kg), last menstrual period 06/14/2011.  Physical Exam  Constitutional: She is oriented to person, place, and time. She appears well-developed and well-nourished. No distress.  Cardiovascular: Normal rate.   Respiratory: Effort normal.  GI: Soft. She exhibits no distension. There is no tenderness.  Genitourinary: Uterus normal. Vaginal discharge (copious cottage cheese like discharge with mild erethema of vagina) found.       Cervix long/closed    Musculoskeletal: Normal range of motion.  Neurological: She is alert and oriented to person, place, and time.  Skin: Skin is warm and dry.  Psychiatric: She has a normal mood and affect.   Results for orders placed during the hospital encounter of 10/16/11 (from the past 24 hour(s))  URINALYSIS, ROUTINE W REFLEX MICROSCOPIC     Status: Abnormal   Collection Time   10/16/11  9:59 PM      Component Value Range   Color, Urine YELLOW  YELLOW   APPearance CLEAR  CLEAR   Specific Gravity, Urine >1.030 (*) 1.005 - 1.030   pH 5.5  5.0 - 8.0   Glucose, UA NEGATIVE  NEGATIVE mg/dL   Hgb urine dipstick SMALL (*) NEGATIVE   Bilirubin Urine NEGATIVE  NEGATIVE   Ketones, ur NEGATIVE  NEGATIVE mg/dL   Protein, ur NEGATIVE  NEGATIVE mg/dL   Urobilinogen, UA 0.2  0.0 - 1.0 mg/dL   Nitrite NEGATIVE  NEGATIVE   Leukocytes, UA SMALL (*) NEGATIVE  URINE MICROSCOPIC-ADD ON     Status: Abnormal   Collection Time   10/16/11  9:59 PM      Component Value Range   Squamous Epithelial / LPF FEW (*) RARE   WBC, UA 7-10  <  3 WBC/hpf   RBC / HPF 7-10  <3 RBC/hpf   Bacteria, UA MANY (*) RARE  WET PREP, GENITAL     Status: Abnormal   Collection Time   10/16/11 10:52 PM      Component Value Range   Yeast Wet Prep HPF POC FEW (*) NONE SEEN   Trich, Wet Prep NONE SEEN  NONE SEEN   Clue Cells Wet Prep HPF POC FEW (*) NONE SEEN   WBC, Wet Prep HPF POC MODERATE (*) NONE SEEN    MAU Course  Procedures  Assessment and Plan  A:  SIUP at [redacted]w[redacted]d       Vaginal Yeast infection  P:  Discharge home       GC Chlamydia done also      Rx Terazol 7 for intravaginal use      Followup with prenatal visit next week as scheduled      [facility] 10/16/2011, 11:01 PM

## 2011-10-18 LAB — OB RESULTS CONSOLE GC/CHLAMYDIA
Chlamydia: NEGATIVE
Gonorrhea: NEGATIVE

## 2011-10-18 LAB — OB RESULTS CONSOLE ABO/RH: RH Type: POSITIVE

## 2011-10-18 LAB — GC/CHLAMYDIA PROBE AMP, GENITAL
Chlamydia, DNA Probe: NEGATIVE
GC Probe Amp, Genital: NEGATIVE

## 2011-10-18 LAB — OB RESULTS CONSOLE RPR: RPR: NONREACTIVE

## 2011-11-08 ENCOUNTER — Other Ambulatory Visit (HOSPITAL_COMMUNITY): Payer: Self-pay | Admitting: Obstetrics

## 2011-11-08 DIAGNOSIS — N883 Incompetence of cervix uteri: Secondary | ICD-10-CM

## 2011-11-09 ENCOUNTER — Ambulatory Visit (HOSPITAL_COMMUNITY)
Admission: RE | Admit: 2011-11-09 | Discharge: 2011-11-09 | Disposition: A | Discharge status: Home / Self Care | Payer: PRIVATE HEALTH INSURANCE | Source: Ambulatory Visit | Attending: Obstetrics | Admitting: Obstetrics

## 2011-11-09 DIAGNOSIS — O26879 Cervical shortening, unspecified trimester: Secondary | ICD-10-CM | POA: Insufficient documentation

## 2011-11-09 DIAGNOSIS — Z3689 Encounter for other specified antenatal screening: Secondary | ICD-10-CM | POA: Insufficient documentation

## 2011-11-09 DIAGNOSIS — N883 Incompetence of cervix uteri: Secondary | ICD-10-CM

## 2011-11-09 IMAGING — US US OB TRANSVAGINAL
2 series · 14 of 26 positions shown · non-contrast
Comparison: none

[Series 1: us ob transvaginal · 13 acquisitions, 7 frames shown (1 of 2)]
[im 1/13]
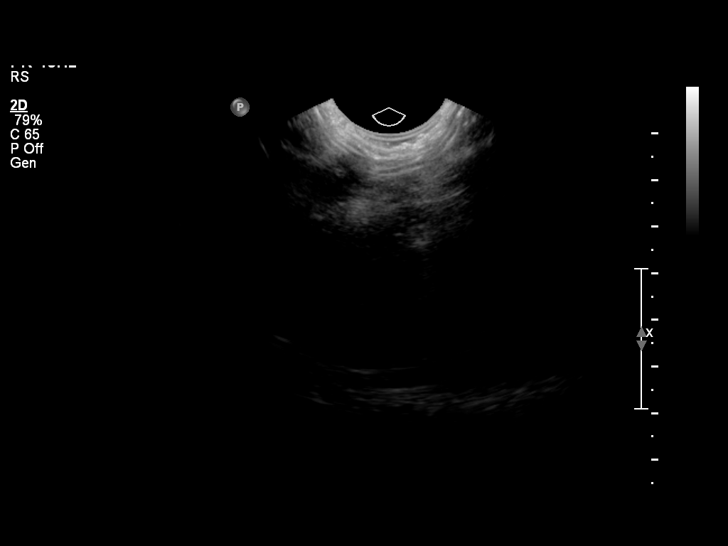
[im 3/13]
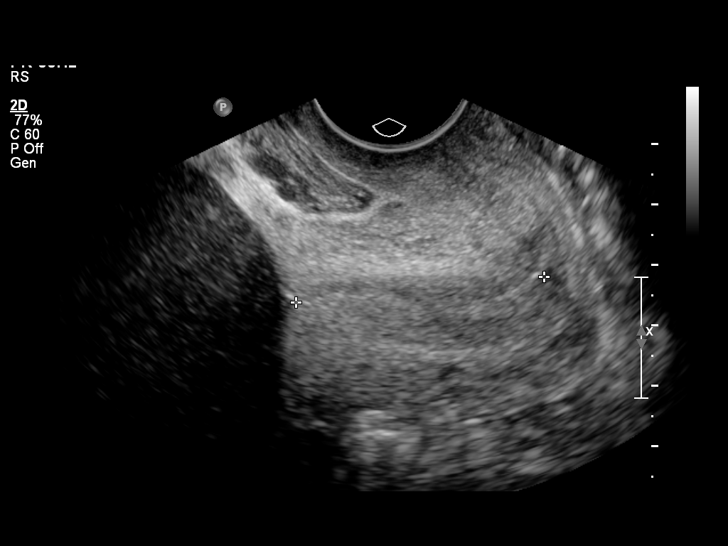
[im 5/13]
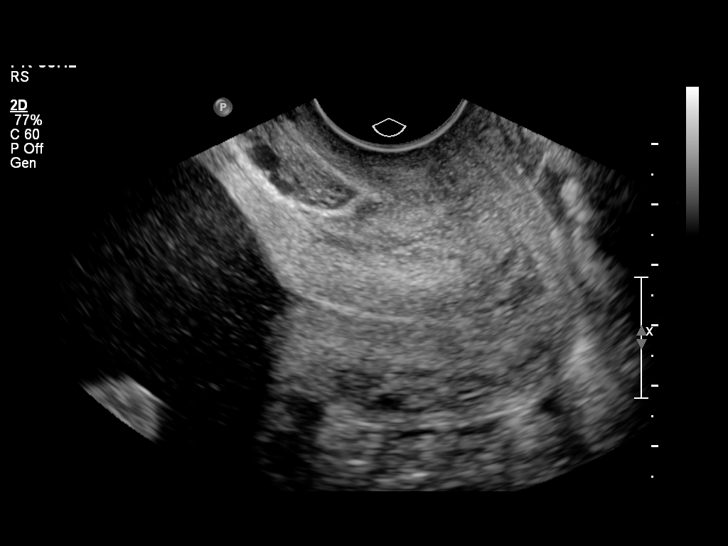
[im 7/13]
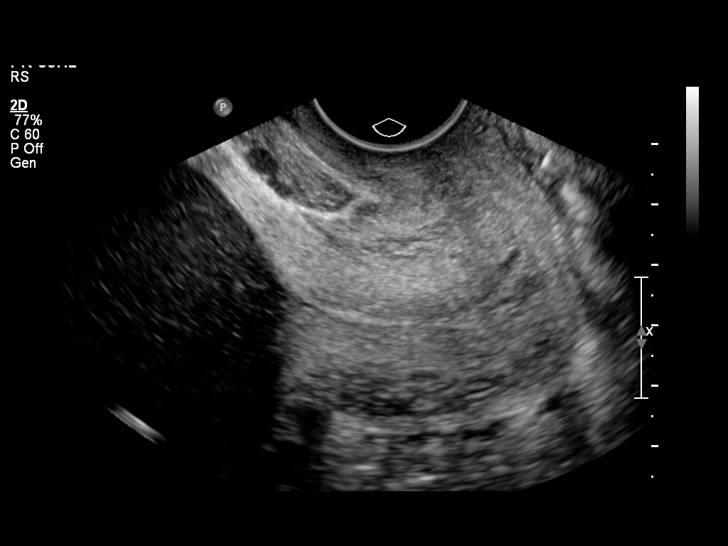
[im 9/13]
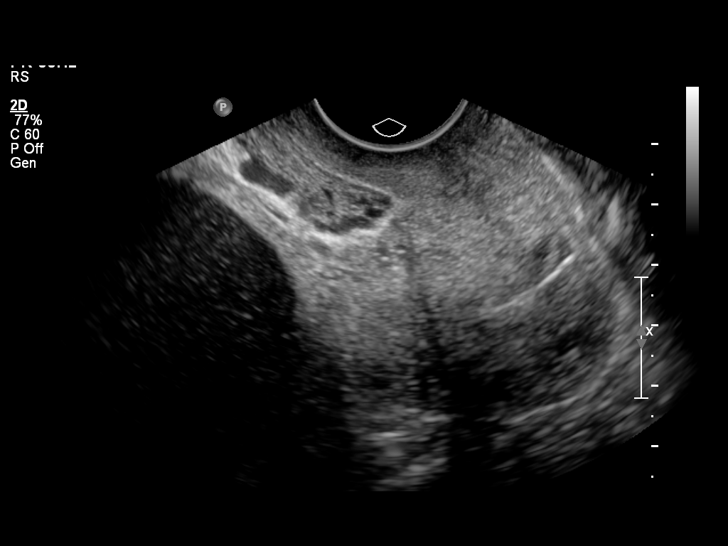
[im 11/13]
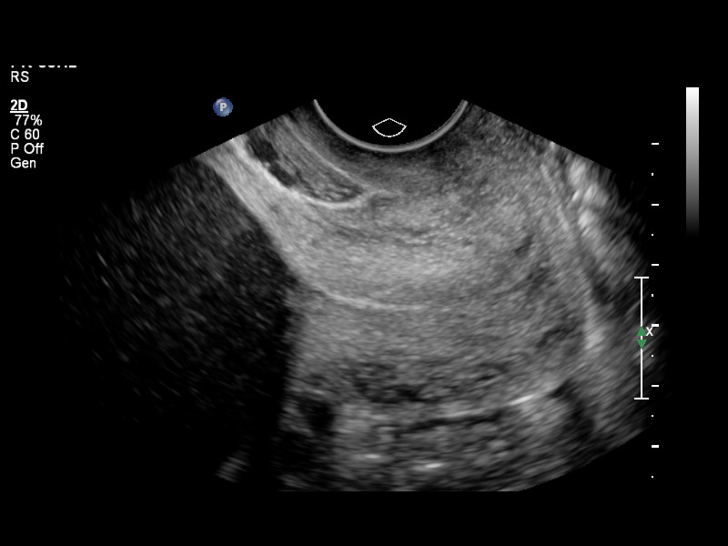
[im 13/13]
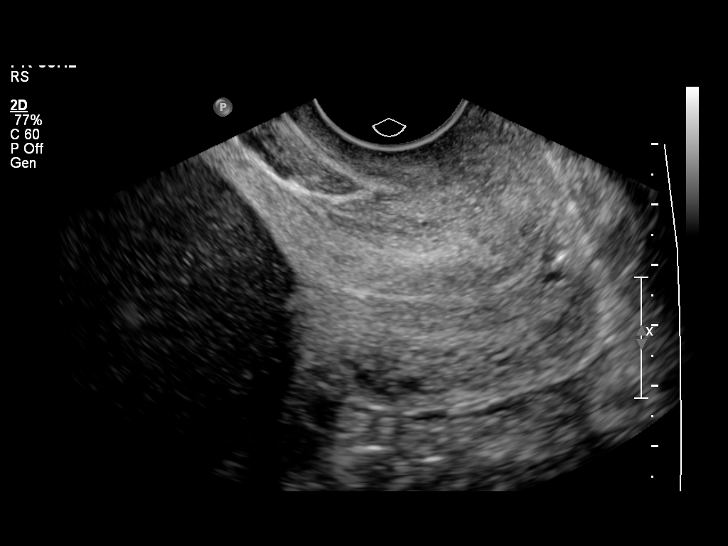

[Series 1: us ob transvaginal · 13 acquisitions, 7 frames shown (2 of 2)]
[im 1/13]
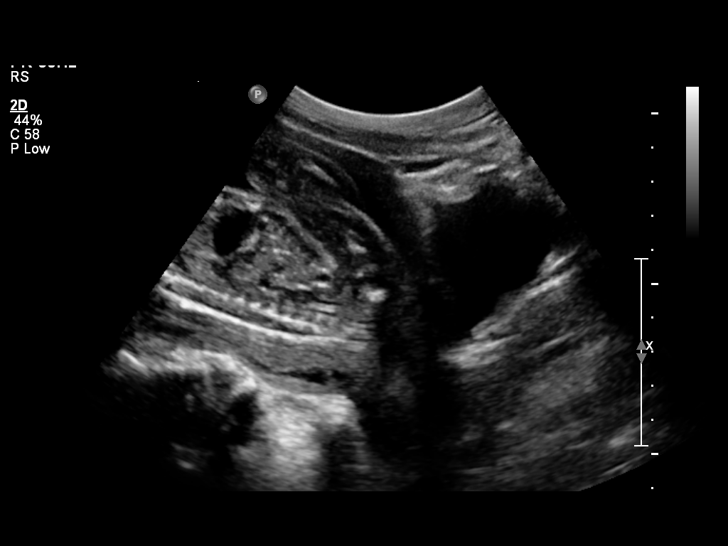
[im 3/13]
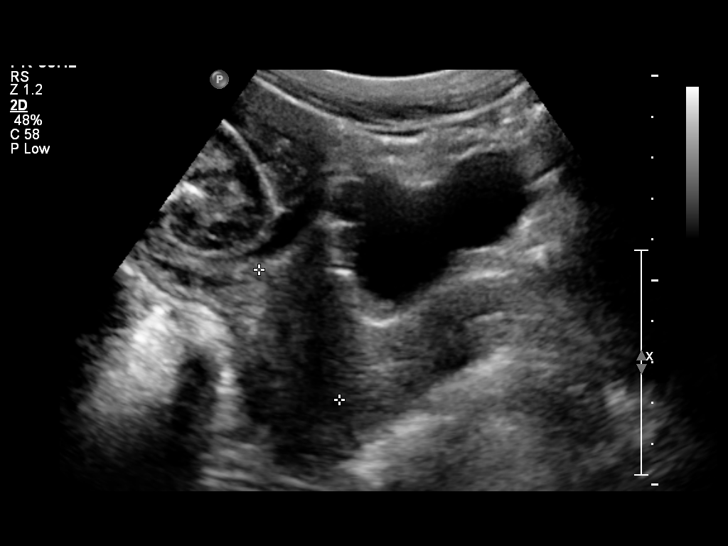
[im 5/13]
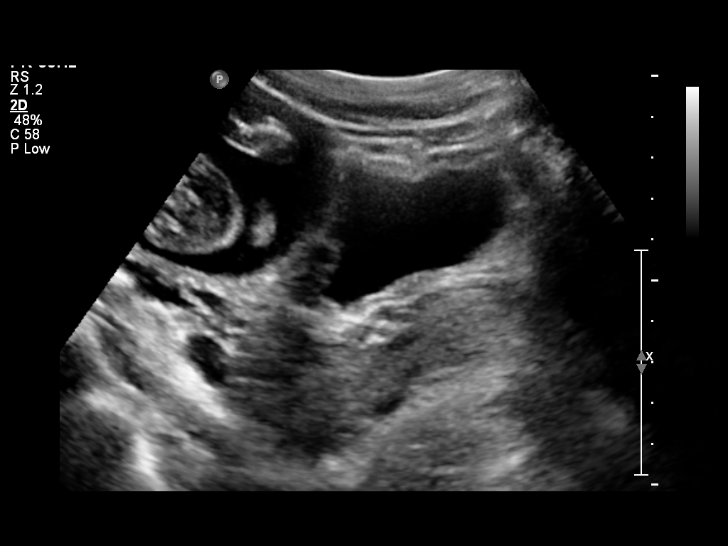
[im 7/13]
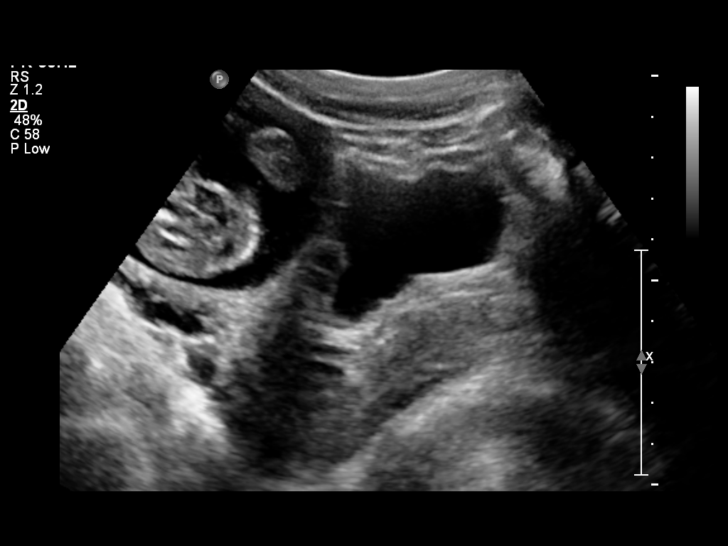
[im 9/13]
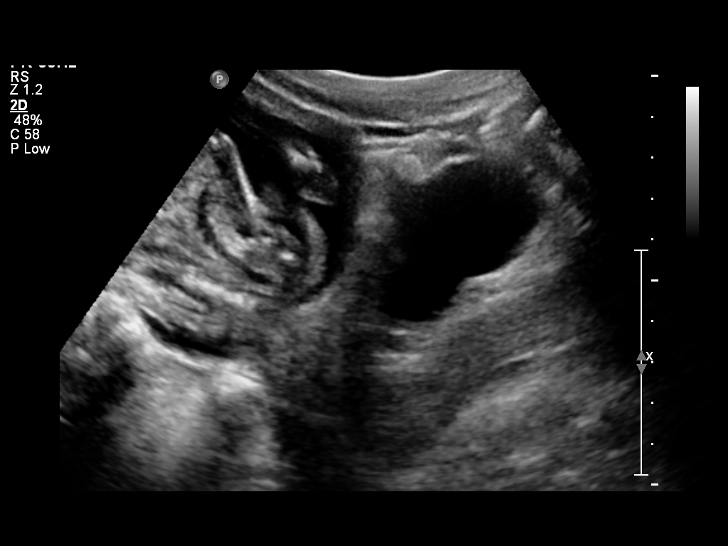
[im 11/13]
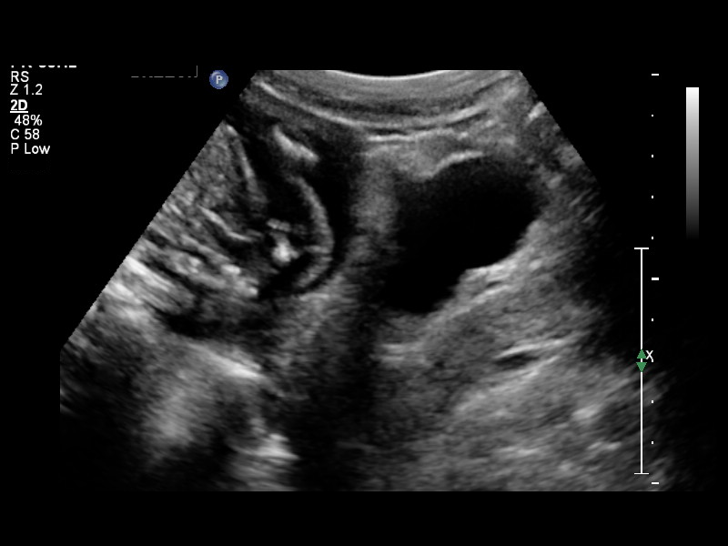
[im 13/13]
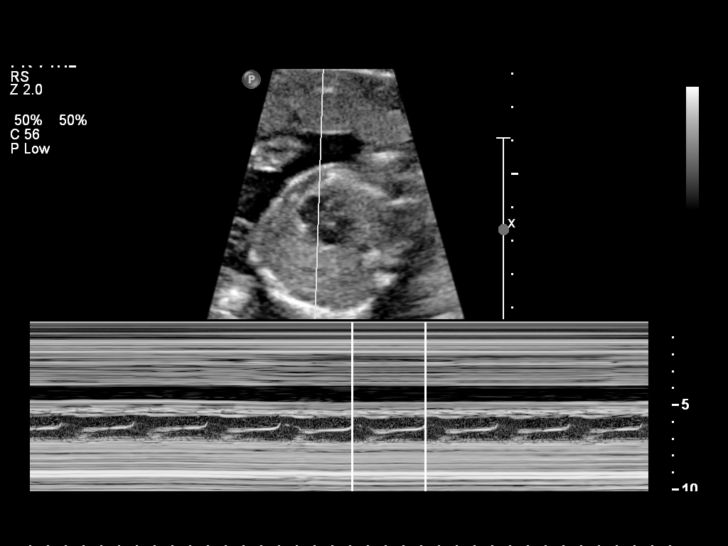

[14 of 26 positions shown; findings below may reference images not displayed]

OBSTETRICS REPORT
                      (Signed Final 11/09/2011 [DATE])

Service(s) Provided

 US OB TRANSVAGINAL                                    76817.0
Indications

 Cervical shortening
Fetal Evaluation

 Num Of Fetuses:    1
 Fetal Heart Rate:  146                         bpm
 Cardiac Activity:  Observed
 Presentation:      Breech
Gestational Age

 LMP:           21w 1d       Date:   06/14/11                 EDD:   03/20/12
 Clinical EDD:  21w 1d                                        EDD:   03/20/12
 Best:          21w 1d    Det. By:   Clinical EDD             EDD:   03/20/12
Cervix Uterus Adnexa

 Cervical Length:   4.1       cm

 Cervix:       Normal appearance by transvaginal scan. Closed.
Impression

 Single living intrauterine fetus in Breech presentation.
 Normal cervical length, measuring 4.1cm.

## 2012-01-26 NOTE — L&D Delivery Note (Signed)
Delivery Note At 3:56 PM a viable female was delivered via Vaginal, Spontaneous Delivery (Presentation: ; Occiput Anterior).  APGAR: 9, 9; weight .   Placenta status: Intact, Manual removal.  Cord: 3 vessels with the following complications: None.  Cord pH: not done  Anesthesia: Epidural  Episiotomy: None Lacerations: None Suture Repair: vicryl Est. Blood Loss (mL):   Mom to postpartum.  Baby to nursery-stable.  MARSHALL,BERNARD A 03/12/2012, 4:24 PM

## 2012-02-21 LAB — OB RESULTS CONSOLE GBS: GBS: NEGATIVE

## 2012-03-12 ENCOUNTER — Inpatient Hospital Stay (HOSPITAL_COMMUNITY)
Admission: AD | Admit: 2012-03-12 | Discharge: 2012-03-13 | DRG: 774 | Disposition: A | Discharge status: Home / Self Care | Payer: Self-pay | Source: Ambulatory Visit | Attending: Obstetrics | Admitting: Obstetrics

## 2012-03-12 ENCOUNTER — Inpatient Hospital Stay (HOSPITAL_COMMUNITY): Payer: Self-pay | Admitting: Anesthesiology

## 2012-03-12 ENCOUNTER — Encounter (HOSPITAL_COMMUNITY): Payer: Self-pay | Admitting: Anesthesiology

## 2012-03-12 ENCOUNTER — Encounter (HOSPITAL_COMMUNITY): Payer: Self-pay | Admitting: Obstetrics and Gynecology

## 2012-03-12 LAB — CBC
HCT: 31.8 % — ABNORMAL LOW (ref 36.0–46.0)
Hemoglobin: 10.8 g/dL — ABNORMAL LOW (ref 12.0–15.0)
MCHC: 34 g/dL (ref 30.0–36.0)
RBC: 3.55 MIL/uL — ABNORMAL LOW (ref 3.87–5.11)

## 2012-03-12 LAB — RPR: RPR Ser Ql: NONREACTIVE

## 2012-03-12 MED ORDER — ONDANSETRON HCL 4 MG PO TABS: 4.0000 mg | ORAL_TABLET | ORAL | Status: DC | PRN

## 2012-03-12 MED ORDER — FLEET ENEMA 7-19 GM/118ML RE ENEM: 1.0000 | ENEMA | RECTAL | Status: DC | PRN

## 2012-03-12 MED ORDER — FENTANYL 2.5 MCG/ML BUPIVACAINE 1/10 % EPIDURAL INFUSION (WH - ANES)
14.0000 mL/h | INTRAMUSCULAR | Status: DC
  Administered 2012-03-12: 14 mL/h via EPIDURAL
  Administered 2012-03-12: 5 mL/h via EPIDURAL
  Filled 2012-03-12: qty 125

## 2012-03-12 MED ORDER — FENTANYL CITRATE 0.05 MG/ML IJ SOLN: 50.0000 ug | INTRAMUSCULAR | Status: DC | PRN

## 2012-03-12 MED ORDER — TERBUTALINE SULFATE 1 MG/ML IJ SOLN: 0.2500 mg | Freq: Once | INTRAMUSCULAR | Status: DC | PRN

## 2012-03-12 MED ORDER — EPHEDRINE 5 MG/ML INJ
10.0000 mg | INTRAVENOUS | Status: DC | PRN
  Filled 2012-03-12: qty 4

## 2012-03-12 MED ORDER — PHENYLEPHRINE 40 MCG/ML (10ML) SYRINGE FOR IV PUSH (FOR BLOOD PRESSURE SUPPORT): 80.0000 ug | PREFILLED_SYRINGE | INTRAVENOUS | Status: DC | PRN

## 2012-03-12 MED ORDER — SIMETHICONE 80 MG PO CHEW: 80.0000 mg | CHEWABLE_TABLET | ORAL | Status: DC | PRN

## 2012-03-12 MED ORDER — BENZOCAINE-MENTHOL 20-0.5 % EX AERO
1.0000 "application " | INHALATION_SPRAY | CUTANEOUS | Status: DC | PRN
  Filled 2012-03-12: qty 56

## 2012-03-12 MED ORDER — PHENYLEPHRINE 40 MCG/ML (10ML) SYRINGE FOR IV PUSH (FOR BLOOD PRESSURE SUPPORT)
80.0000 ug | PREFILLED_SYRINGE | INTRAVENOUS | Status: DC | PRN
  Filled 2012-03-12: qty 5

## 2012-03-12 MED ORDER — OXYTOCIN 40 UNITS IN LACTATED RINGERS INFUSION - SIMPLE MED
62.5000 mL/h | INTRAVENOUS | Status: DC
  Administered 2012-03-12: 500 mL/h via INTRAVENOUS

## 2012-03-12 MED ORDER — LACTATED RINGERS IV SOLN
500.0000 mL | Freq: Once | INTRAVENOUS | Status: AC
  Administered 2012-03-12: 500 mL via INTRAVENOUS

## 2012-03-12 MED ORDER — DIPHENHYDRAMINE HCL 50 MG/ML IJ SOLN: 12.5000 mg | INTRAMUSCULAR | Status: DC | PRN

## 2012-03-12 MED ORDER — OXYTOCIN BOLUS FROM INFUSION: 500.0000 mL | INTRAVENOUS | Status: DC

## 2012-03-12 MED ORDER — DIBUCAINE 1 % RE OINT: 1.0000 "application " | TOPICAL_OINTMENT | RECTAL | Status: DC | PRN

## 2012-03-12 MED ORDER — WITCH HAZEL-GLYCERIN EX PADS: 1.0000 "application " | MEDICATED_PAD | CUTANEOUS | Status: DC | PRN

## 2012-03-12 MED ORDER — FERROUS SULFATE 325 (65 FE) MG PO TABS
325.0000 mg | ORAL_TABLET | Freq: Two times a day (BID) | ORAL | Status: DC
  Administered 2012-03-13 (×2): 325 mg via ORAL
  Filled 2012-03-12 (×2): qty 1

## 2012-03-12 MED ORDER — CITRIC ACID-SODIUM CITRATE 334-500 MG/5ML PO SOLN: 30.0000 mL | ORAL | Status: DC | PRN

## 2012-03-12 MED ORDER — ACETAMINOPHEN 325 MG PO TABS: 650.0000 mg | ORAL_TABLET | ORAL | Status: DC | PRN

## 2012-03-12 MED ORDER — ONDANSETRON HCL 4 MG/2ML IJ SOLN: 4.0000 mg | INTRAMUSCULAR | Status: DC | PRN

## 2012-03-12 MED ORDER — LIDOCAINE HCL (PF) 1 % IJ SOLN
INTRAMUSCULAR | Status: DC | PRN
  Administered 2012-03-12 (×2): 5 mL

## 2012-03-12 MED ORDER — DIPHENHYDRAMINE HCL 25 MG PO CAPS: 25.0000 mg | ORAL_CAPSULE | Freq: Four times a day (QID) | ORAL | Status: DC | PRN

## 2012-03-12 MED ORDER — EPHEDRINE 5 MG/ML INJ: 10.0000 mg | INTRAVENOUS | Status: DC | PRN

## 2012-03-12 MED ORDER — LANOLIN HYDROUS EX OINT: TOPICAL_OINTMENT | CUTANEOUS | Status: DC | PRN

## 2012-03-12 MED ORDER — SENNOSIDES-DOCUSATE SODIUM 8.6-50 MG PO TABS
2.0000 | ORAL_TABLET | Freq: Every day | ORAL | Status: DC
  Administered 2012-03-12: 2 via ORAL

## 2012-03-12 MED ORDER — ONDANSETRON HCL 4 MG/2ML IJ SOLN: 4.0000 mg | Freq: Four times a day (QID) | INTRAMUSCULAR | Status: DC | PRN

## 2012-03-12 MED ORDER — LIDOCAINE HCL (PF) 1 % IJ SOLN: 30.0000 mL | INTRAMUSCULAR | Status: DC | PRN

## 2012-03-12 MED ORDER — ZOLPIDEM TARTRATE 5 MG PO TABS: 5.0000 mg | ORAL_TABLET | Freq: Every evening | ORAL | Status: DC | PRN

## 2012-03-12 MED ORDER — LACTATED RINGERS IV SOLN
INTRAVENOUS | Status: DC
  Administered 2012-03-12 (×2): via INTRAVENOUS

## 2012-03-12 MED ORDER — OXYTOCIN 40 UNITS IN LACTATED RINGERS INFUSION - SIMPLE MED
1.0000 m[IU]/min | INTRAVENOUS | Status: DC
  Administered 2012-03-12: 2 m[IU]/min via INTRAVENOUS
  Filled 2012-03-12: qty 1000

## 2012-03-12 MED ORDER — OXYCODONE-ACETAMINOPHEN 5-325 MG PO TABS: 1.0000 | ORAL_TABLET | ORAL | Status: DC | PRN

## 2012-03-12 MED ORDER — PRENATAL MULTIVITAMIN CH
1.0000 | ORAL_TABLET | Freq: Every day | ORAL | Status: DC
  Administered 2012-03-12 – 2012-03-13 (×2): 1 via ORAL
  Filled 2012-03-12 (×2): qty 1

## 2012-03-12 MED ORDER — TETANUS-DIPHTH-ACELL PERTUSSIS 5-2.5-18.5 LF-MCG/0.5 IM SUSP: 0.5000 mL | Freq: Once | INTRAMUSCULAR | Status: DC

## 2012-03-12 MED ORDER — IBUPROFEN 600 MG PO TABS
600.0000 mg | ORAL_TABLET | Freq: Four times a day (QID) | ORAL | Status: DC
  Administered 2012-03-12 – 2012-03-13 (×5): 600 mg via ORAL
  Filled 2012-03-12 (×3): qty 1

## 2012-03-12 MED ORDER — LACTATED RINGERS IV SOLN: 500.0000 mL | INTRAVENOUS | Status: DC | PRN

## 2012-03-12 MED ORDER — IBUPROFEN 600 MG PO TABS
600.0000 mg | ORAL_TABLET | Freq: Four times a day (QID) | ORAL | Status: DC | PRN
  Filled 2012-03-12: qty 1

## 2012-03-12 NOTE — MAU Note (Signed)
"  I awoke from sleeping and my underwear were wet.  I went to the BR to change my underwear and use the BR.  My underwear remained dry after that.  Then about 0830 a lot more fluid gushed out.  I was having back pain at about 0600, but I went back to sleep.  I didn't start having UC's until after the bigger gush at 0830; very mild.  (+) FM.  No VB."

## 2012-03-12 NOTE — Progress Notes (Signed)
Notified of codeine cross-reaction to Nubain as well and recommendation from pharmacy is to Rx Fentanyl 50-100 mcg every 1-2 hours prn pain.  Orders received for Fentanyl 50 mcg every 1 hr prn  Pain received.

## 2012-03-12 NOTE — Anesthesia Preprocedure Evaluation (Signed)
Anesthesia Evaluation  Patient identified by MRN, date of birth, ID band Patient awake    Reviewed: Allergy & Precautions, H&P , Patient's Chart, lab work & pertinent test results  Airway Mallampati: II TM Distance: >3 FB Neck ROM: full    Dental no notable dental hx.    Pulmonary neg pulmonary ROS, asthma ,  breath sounds clear to auscultation  Pulmonary exam normal       Cardiovascular negative cardio ROS  Rhythm:regular Rate:Normal     Neuro/Psych negative neurological ROS  negative psych ROS   GI/Hepatic negative GI ROS, Neg liver ROS,   Endo/Other  negative endocrine ROS  Renal/GU negative Renal ROS     Musculoskeletal   Abdominal   Peds  Hematology negative hematology ROS (+)   Anesthesia Other Findings   Reproductive/Obstetrics (+) Pregnancy                           Anesthesia Physical Anesthesia Plan  ASA: II  Anesthesia Plan: Epidural   Post-op Pain Management:    Induction:   Airway Management Planned:   Additional Equipment:   Intra-op Plan:   Post-operative Plan:   Informed Consent: I have reviewed the patients History and Physical, chart, labs and discussed the procedure including the risks, benefits and alternatives for the proposed anesthesia with the patient or authorized representative who has indicated his/her understanding and acceptance.     Plan Discussed with:   Anesthesia Plan Comments:         Anesthesia Quick Evaluation

## 2012-03-12 NOTE — Progress Notes (Signed)
notified of codeine allergy/cross-reaction to Stadol; new orders for Nubain 5 mg every 2 hours prn pain received

## 2012-03-12 NOTE — Anesthesia Procedure Notes (Signed)
Epidural Patient location during procedure: OB Start time: 03/12/2012 2:42 PM  Staffing Anesthesiologist: Angus Seller., Harrell Gave. Performed by: anesthesiologist   Preanesthetic Checklist Completed: patient identified, site marked, surgical consent, pre-op evaluation, timeout performed, IV checked, risks and benefits discussed and monitors and equipment checked  Epidural Patient position: sitting Prep: site prepped and draped and DuraPrep Patient monitoring: continuous pulse ox and blood pressure Approach: midline Injection technique: LOR air and LOR saline  Needle:  Needle type: Tuohy  Needle gauge: 17 G Needle length: 9 cm and 9 Needle insertion depth: 5 cm cm Catheter type: closed end flexible Catheter size: 19 Gauge Catheter at skin depth: 10 cm Test dose: negative  Assessment Events: blood not aspirated, injection not painful, no injection resistance, negative IV test and no paresthesia  Additional Notes Patient identified.  Risk benefits discussed including failed block, incomplete pain control, headache, nerve damage, paralysis, blood pressure changes, nausea, vomiting, reactions to medication both toxic or allergic, and postpartum back pain.  Patient expressed understanding and wished to proceed.  All questions were answered.  Sterile technique used throughout procedure and epidural site dressed with sterile barrier dressing. No paresthesia or other complications noted.The patient did not experience any signs of intravascular injection such as tinnitus or metallic taste in mouth nor signs of intrathecal spread such as rapid motor block. Please see nursing notes for vital signs.

## 2012-03-12 NOTE — Plan of Care (Signed)
Problem: Phase I Progression Outcomes Goal: Initial discharge plan identified Outcome: Completed/Met Date Met:  03/12/12 Pt wants early discharge

## 2012-03-12 NOTE — H&P (Signed)
This is Dr. Francoise Ceo dictating the history and physical on  Rachel Patel she's a 27 year old gravida 4 para 2012 at 22 weeks and 6 days EDC 03/20/2012 was admitted today to centimeters dilated ruptured membranes negative GBS was started on Pitocin once the patient became 3 cm nightly rapidly and was delivered by the nurse of a female Apgar 99 placenta spontaneous by me no lacerations Past medical history negative Past surgical history negative Social history negative System review noncontributory Physical exam well-developed female in labor HEENT negative Breasts negative Heart regular rhythm no murmurs no gallops Abdomen 20 week size postpartum Pelvic as described above Extremities negative

## 2012-03-13 ENCOUNTER — Encounter (HOSPITAL_COMMUNITY): Payer: Self-pay | Admitting: *Deleted

## 2012-03-13 LAB — CBC
MCV: 90 fL (ref 78.0–100.0)
Platelets: 214 10*3/uL (ref 150–400)
RBC: 3.21 MIL/uL — ABNORMAL LOW (ref 3.87–5.11)
WBC: 12.3 10*3/uL — ABNORMAL HIGH (ref 4.0–10.5)

## 2012-03-13 LAB — ABO/RH: ABO/RH(D): O POS

## 2012-03-13 NOTE — Progress Notes (Signed)
UR chart review completed.  

## 2012-03-13 NOTE — Discharge Summary (Signed)
Obstetric Discharge Summary Reason for Admission: onset of labor Prenatal Procedures: none Intrapartum Procedures: spontaneous vaginal delivery Postpartum Procedures: none Complications-Operative and Postpartum: none Hemoglobin  Date Value Range Status  03/13/2012 9.8* 12.0 - 15.0 g/dL Final     HCT  Date Value Range Status  03/13/2012 28.9* 36.0 - 46.0 % Final    Physical Exam:  General: alert Lochia: appropriate Uterine Fundus: firm Incision: healing well DVT Evaluation: No evidence of DVT seen on physical exam.  Discharge Diagnoses: Term Pregnancy-delivered  Discharge Information: Date: 03/13/2012 Activity: pelvic rest Diet: routine Medications: None Condition: stable Instructions: refer to practice specific booklet Discharge to: home Follow-up Information   Follow up with Miquan Tandon A, MD. Call in 6 weeks.   Contact information:   [address] [place] [address] [phone]       Newborn Data: Live born female  Birth Weight: 5 lb 7.4 oz (2478 g) APGAR: 9, 9  Home with mother.  Nikos Anglemyer A 03/13/2012, 12:02 PM

## 2012-03-13 NOTE — Progress Notes (Signed)
Patient ID: Rachel Patel, female   DOB: [birth year], 27 y.o.   MRN: [id number] Postpartum day one Vital signs normal Fundus firm Legs negative Doing well

## 2012-03-13 NOTE — Anesthesia Postprocedure Evaluation (Signed)
  Anesthesia Post-op Note  Patient: Rachel Patel  Procedure(s) Performed: * No procedures listed *  Patient Location: Mother/Baby  Anesthesia Type:Epidural  Level of Consciousness: awake, alert , oriented and patient cooperative  Airway and Oxygen Therapy: Patient Spontanous Breathing  Post-op Pain: mild  Post-op Assessment: Patient's Cardiovascular Status Stable, Respiratory Function Stable, Patent Airway, No signs of Nausea or vomiting, Adequate PO intake and Pain level controlled  Post-op Vital Signs: Reviewed and stable  Complications: No apparent anesthesia complications

## 2012-05-23 ENCOUNTER — Encounter (HOSPITAL_COMMUNITY): Payer: Self-pay | Admitting: Emergency Medicine

## 2012-05-23 ENCOUNTER — Emergency Department (HOSPITAL_COMMUNITY)
Admission: EM | Admit: 2012-05-23 | Discharge: 2012-05-23 | Disposition: A | Discharge status: Home / Self Care | Payer: Self-pay | Attending: Emergency Medicine | Admitting: Emergency Medicine

## 2012-05-23 DIAGNOSIS — K089 Disorder of teeth and supporting structures, unspecified: Secondary | ICD-10-CM | POA: Insufficient documentation

## 2012-05-23 DIAGNOSIS — K0889 Other specified disorders of teeth and supporting structures: Secondary | ICD-10-CM

## 2012-05-23 DIAGNOSIS — J45909 Unspecified asthma, uncomplicated: Secondary | ICD-10-CM | POA: Insufficient documentation

## 2012-05-23 DIAGNOSIS — Z87891 Personal history of nicotine dependence: Secondary | ICD-10-CM | POA: Insufficient documentation

## 2012-05-23 DIAGNOSIS — I10 Essential (primary) hypertension: Secondary | ICD-10-CM | POA: Insufficient documentation

## 2012-05-23 MED ORDER — PENICILLIN V POTASSIUM 500 MG PO TABS: 500.0000 mg | ORAL_TABLET | Freq: Four times a day (QID) | ORAL | Status: DC

## 2012-05-23 MED ORDER — TRAMADOL HCL 50 MG PO TABS: 50.0000 mg | ORAL_TABLET | Freq: Four times a day (QID) | ORAL | Status: DC | PRN

## 2012-05-23 MED ORDER — TRAMADOL HCL 50 MG PO TABS
50.0000 mg | ORAL_TABLET | Freq: Once | ORAL | Status: AC
  Administered 2012-05-23: 50 mg via ORAL
  Filled 2012-05-23: qty 1

## 2012-05-23 MED ORDER — PENICILLIN V POTASSIUM 250 MG PO TABS
500.0000 mg | ORAL_TABLET | Freq: Four times a day (QID) | ORAL | Status: DC
  Administered 2012-05-23: 500 mg via ORAL
  Filled 2012-05-23: qty 2

## 2012-05-23 NOTE — ED Notes (Signed)
Pt c/o right lower dental pain x 2 days.

## 2012-05-23 NOTE — ED Provider Notes (Signed)
History     CSN: [id number]  Arrival date & time 05/23/12  1813   First MD Initiated Contact with Patient 05/23/12 1840      Chief Complaint  Patient presents with  . Dental Pain    (Consider location/radiation/quality/duration/timing/severity/associated sxs/prior treatment) HPI Comments: The patient is a 27 year old otherwise healthy female who presents with dental pain that started gradually 2 days ago. The dental pain is severe, constant and progressively worsening. The pain is aching and located in right lower jaw. The pain does not radiate. Eating makes the pain worse. Nothing makes the pain better. The patient has tried ibuprofen for pain without relief. No associated symptoms. Patient denies headache, neck pain/stiffness, fever, NVD, edema, sore throat, throat swelling, wheezing, SOB, chest pain, abdominal pain.      Past Medical History  Diagnosis Date  . Asthma     during pregnancy  . Hypertension     during pregnancy    Past Surgical History  Procedure Laterality Date  . No past surgeries      Family History  Problem Relation Age of Onset  . Other Neg Hx   . Hypertension Mother     PREECLAMPSIA    History  Substance Use Topics  . Smoking status: Former Smoker -- 0.25 packs/day    Types: Cigarettes    Quit date: 01/27/2012  . Smokeless tobacco: Never Used  . Alcohol Use: No    OB History   Grav Para Term Preterm Abortions TAB SAB Ect Mult Living   4 3 3  1 1    3       Review of Systems  HENT: Positive for dental problem.   All other systems reviewed and are negative.    Allergies  Codeine  Home Medications  No current outpatient prescriptions on file.  BP 133/93  Pulse 82  Temp(Src) 98.2 F (36.8 C) (Oral)  Resp 18  SpO2 98%  LMP 05/16/2012  Physical Exam  Nursing note and vitals reviewed. Constitutional: She is oriented to person, place, and time. She appears well-developed and well-nourished. No distress.  HENT:  Head:  Normocephalic and atraumatic.  Mouth/Throat: Oropharynx is clear and moist. No oropharyngeal exudate.  Poor dentition. Multiple decayed teeth. Right lower jaw posterior molar tender to percussion.   Eyes: Conjunctivae are normal.  Neck: Normal range of motion.  Cardiovascular: Normal rate and regular rhythm.  Exam reveals no gallop and no friction rub.   No murmur heard. Pulmonary/Chest: Effort normal and breath sounds normal. She has no wheezes. She has no rales. She exhibits no tenderness.  Abdominal: Soft. There is no tenderness.  Musculoskeletal: Normal range of motion.  Lymphadenopathy:    She has no cervical adenopathy.  Neurological: She is alert and oriented to person, place, and time. Coordination normal.  Speech is goal-oriented. Moves limbs without ataxia.   Skin: Skin is warm and dry.  Psychiatric: She has a normal mood and affect. Her behavior is normal.    ED Course  Procedures (including critical care time)  Labs Reviewed - No data to display No results found.   1. Pain, dental       MDM  6:49 PM Patient will have Penicillin VK and Tramadol for pain. Patient instructed to call dentist within 48 hours of being seen in the ED to guarantee visit. Vitals stable and patient afebrile. Patient instructed to return to the ED with worsening or concerning symptoms.        Emilia Beck, PA-C  05/23/12 2051 

## 2012-05-23 NOTE — ED Provider Notes (Signed)
Medical screening examination/treatment/procedure(s) were performed by non-physician practitioner and as supervising physician I was immediately available for consultation/collaboration.   Dione Booze, MD 05/23/12 2140

## 2012-05-23 NOTE — ED Notes (Signed)
Patient came to the ER with complaint of toothache x a couple of months. No fever, no difficulty swallowing per patient.

## 2012-07-16 ENCOUNTER — Encounter (HOSPITAL_COMMUNITY): Payer: Self-pay

## 2012-07-16 ENCOUNTER — Emergency Department (INDEPENDENT_AMBULATORY_CARE_PROVIDER_SITE_OTHER)
Admission: EM | Admit: 2012-07-16 | Discharge: 2012-07-16 | Disposition: A | Discharge status: Home / Self Care | Payer: Self-pay | Source: Home / Self Care | Attending: Family Medicine | Admitting: Family Medicine

## 2012-07-16 ENCOUNTER — Emergency Department (INDEPENDENT_AMBULATORY_CARE_PROVIDER_SITE_OTHER): Payer: Medicaid Other

## 2012-07-16 DIAGNOSIS — M2240 Chondromalacia patellae, unspecified knee: Secondary | ICD-10-CM

## 2012-07-16 DIAGNOSIS — M6750 Plica syndrome, unspecified knee: Secondary | ICD-10-CM

## 2012-07-16 DIAGNOSIS — M6752 Plica syndrome, left knee: Secondary | ICD-10-CM

## 2012-07-16 IMAGING — CR DG KNEE COMPLETE 4+V*L*
5 series · 5 of 5 positions shown · non-contrast
Comparison: None

CLINICAL DATA: Left knee pain, no known injury

LEFT KNEE - COMPLETE 4+ VIEW

[view not recorded (1 of 5)]
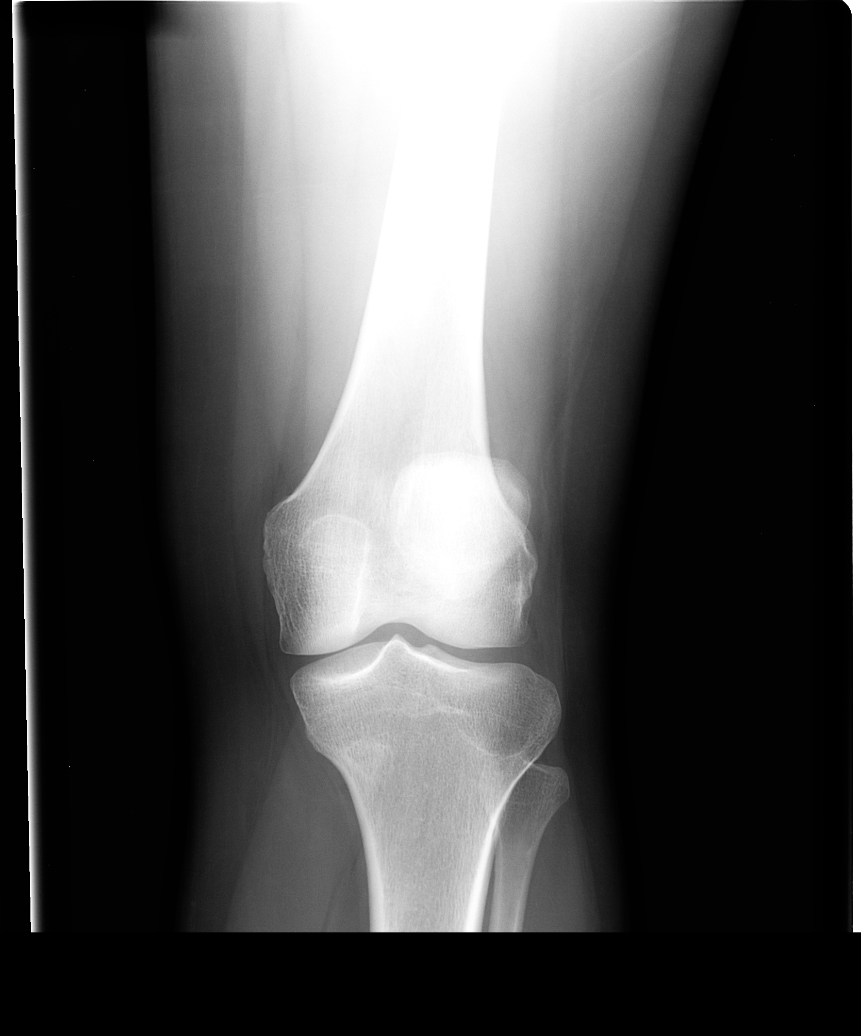

[view not recorded (2 of 5)]
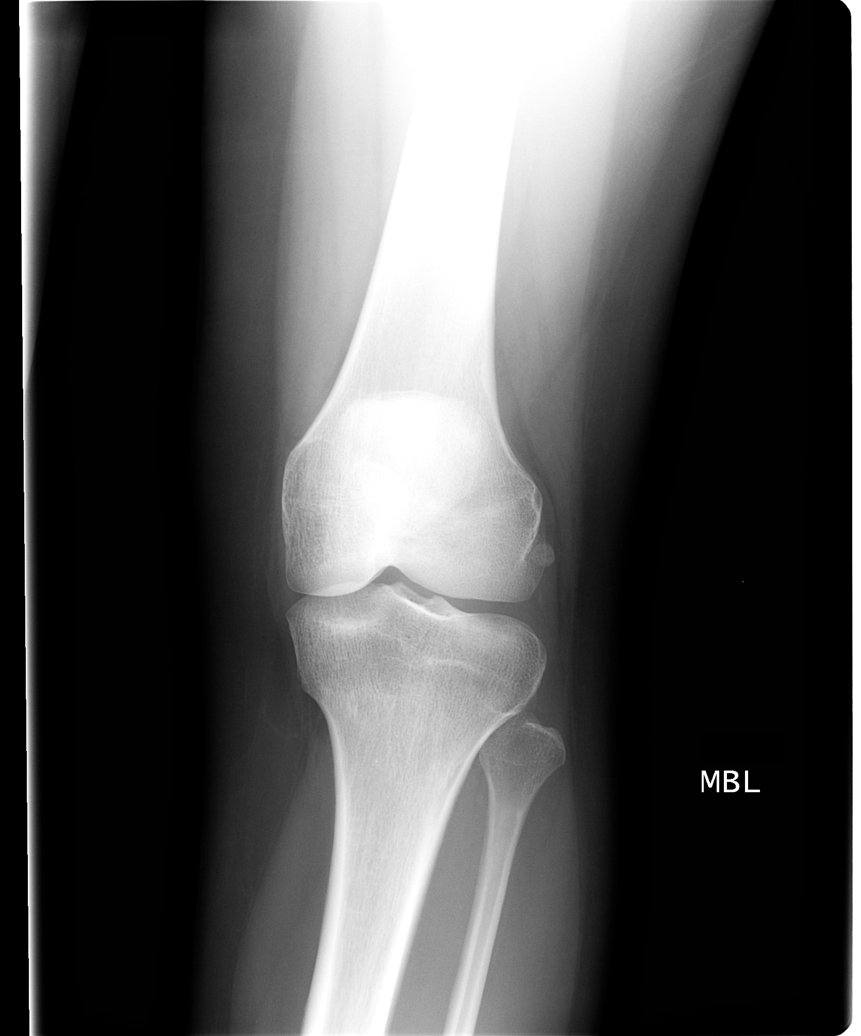

[view not recorded (3 of 5)]
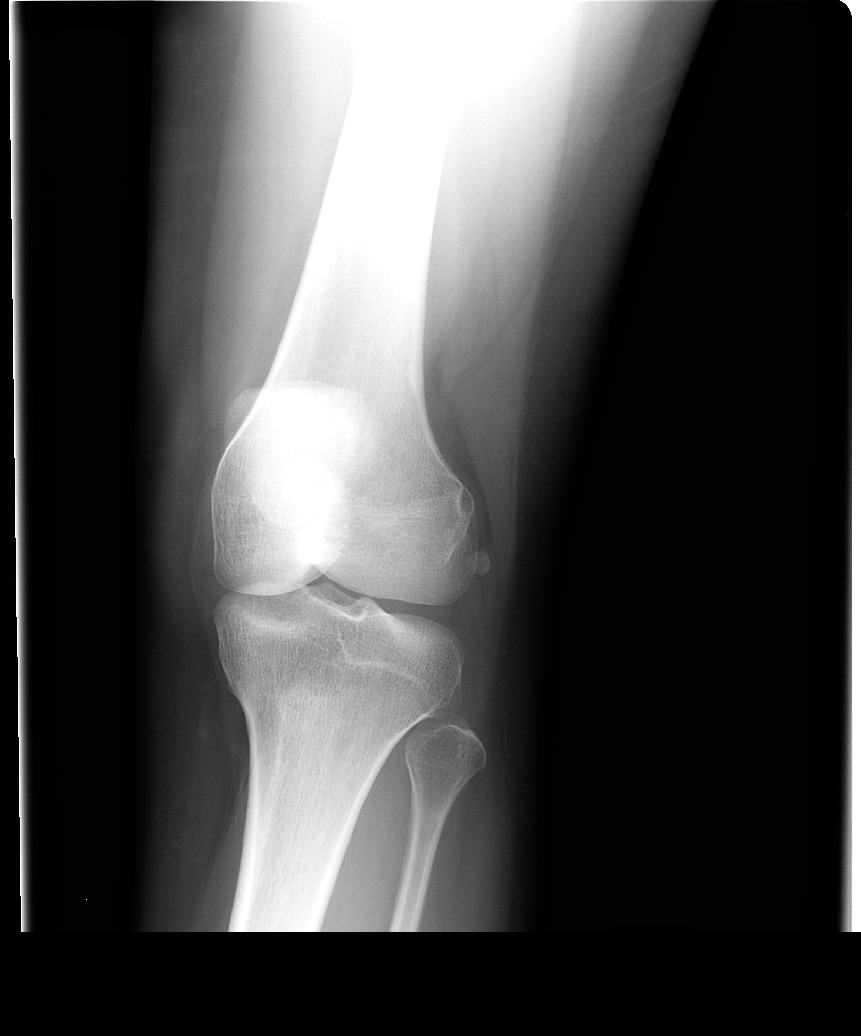

[view not recorded (4 of 5)]
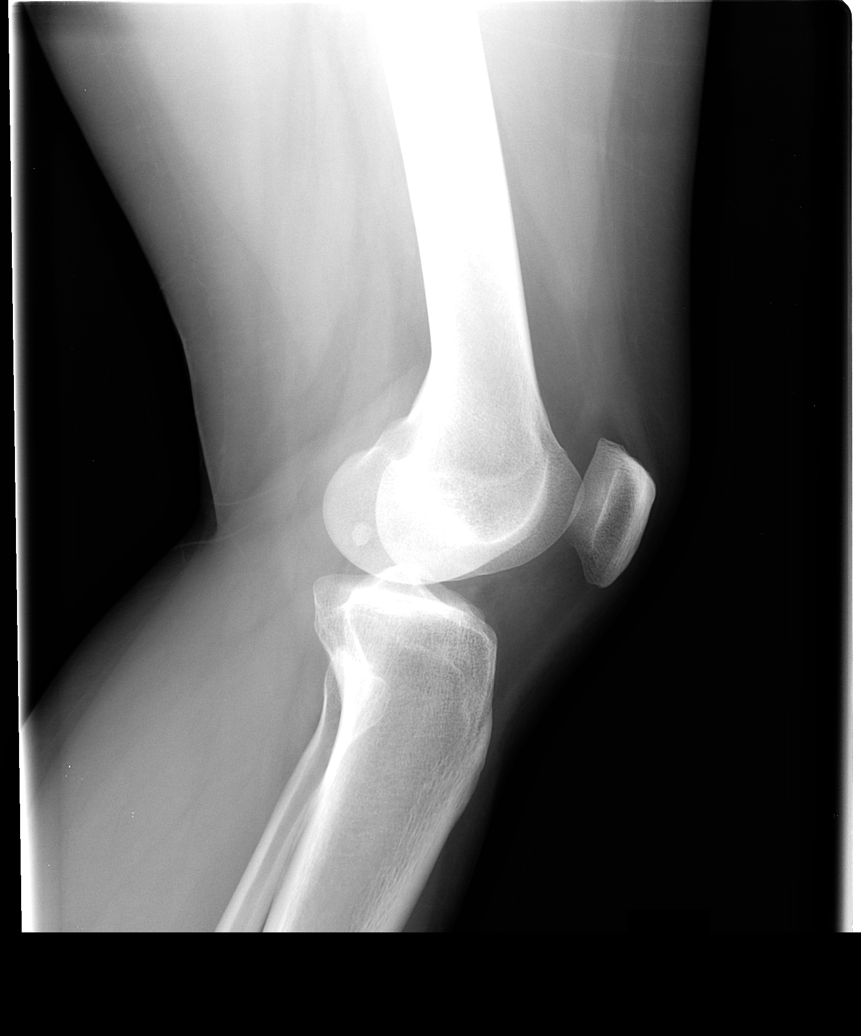

[view not recorded (5 of 5)]
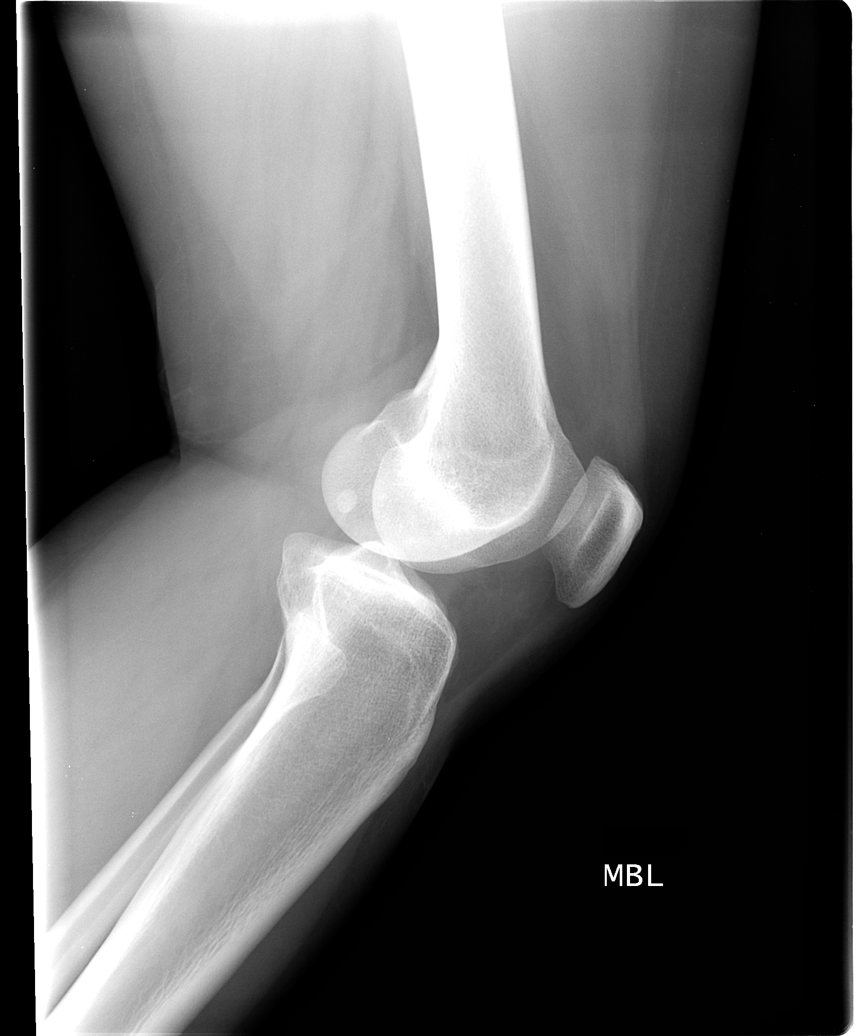

[5 of 5 positions shown; findings below may reference images not displayed]

FINDINGS: There is no fracture or dislocation of the left knee.  No
joint effusion.
IMPRESSION: Normal exam of the knee.

## 2012-07-16 MED ORDER — LIDOCAINE HCL (PF) 1 % IJ SOLN
INTRAMUSCULAR | Status: AC
  Filled 2012-07-16: qty 5

## 2012-07-16 MED ORDER — METHYLPREDNISOLONE ACETATE 80 MG/ML IJ SUSP
INTRAMUSCULAR | Status: AC
  Filled 2012-07-16: qty 1

## 2012-07-16 MED ORDER — METHYLPREDNISOLONE ACETATE 80 MG/ML IJ SUSP
80.0000 mg | Freq: Once | INTRAMUSCULAR | Status: AC
  Administered 2012-07-16: 80 mg via INTRA_ARTICULAR

## 2012-07-16 MED ORDER — BUPIVACAINE HCL 0.5 % IJ SOLN
6.0000 mL | Freq: Once | INTRAMUSCULAR | Status: AC
  Administered 2012-07-16: 6 mL

## 2012-07-16 NOTE — ED Provider Notes (Signed)
History     CSN: [id number]  Arrival date & time 07/16/12  1334   First MD Initiated Contact with Patient 07/16/12 1421      Chief Complaint  Patient presents with  . Knee Pain    (Consider location/radiation/quality/duration/timing/severity/associated sxs/prior treatment) HPI   27 yo bf presents today with above complaint.  States that about 5 years ago she was in the hospital giving birth to her son.  She got up and felt her "knee cap" move out of place.  Since that event she has continued to have off and on knee pain with some feeling of PF instability.  Symptoms tolerable until she woke up this morning with severe pain and mechanical symptoms.  Knee has been feeling locked.  No recent injury that she can recall.  Pain with weightbearing.  No other issues.   Past Medical History  Diagnosis Date  . Asthma     during pregnancy  . Hypertension     during pregnancy    Past Surgical History  Procedure Laterality Date  . No past surgeries      Family History  Problem Relation Age of Onset  . Other Neg Hx   . Hypertension Mother     PREECLAMPSIA    History  Substance Use Topics  . Smoking status: Current Every Day Smoker -- 0.25 packs/day    Types: Cigarettes  . Smokeless tobacco: Never Used  . Alcohol Use: No    OB History   Grav Para Term Preterm Abortions TAB SAB Ect Mult Living   4 3 3  1 1    3       Review of Systems  Constitutional: Negative.   HENT: Negative.   Eyes: Negative.   Respiratory: Negative.   Cardiovascular: Negative.   Gastrointestinal: Negative.   Endocrine: Negative.   Genitourinary: Negative.   Musculoskeletal:       Left knee pain with mechanical symptoms.  Some swelling.   Skin: Negative.   Neurological: Negative.   Psychiatric/Behavioral: Negative.     Allergies  Codeine  Home Medications   Current Outpatient Rx  Name  Route  Sig  Dispense  Refill  . cetirizine (ZYRTEC) 10 MG tablet   Oral   Take 10 mg by mouth daily as  needed for allergies.         Marland Kitchen ibuprofen (ADVIL,MOTRIN) 200 MG tablet   Oral   Take 400 mg by mouth every 6 (six) hours as needed for pain.         Marland Kitchen penicillin v potassium (VEETID) 500 MG tablet   Oral   Take 1 tablet (500 mg total) by mouth 4 (four) times daily.   40 tablet   0   . traMADol (ULTRAM) 50 MG tablet   Oral   Take 1 tablet (50 mg total) by mouth every 6 (six) hours as needed for pain.   15 tablet   0     LMP 06/13/2012  Breastfeeding? No  Physical Exam  Constitutional: She is oriented to person, place, and time. She appears well-developed and well-nourished.  HENT:  Head: Normocephalic and atraumatic.  Eyes: Conjunctivae and EOM are normal. Pupils are equal, round, and reactive to light.  Neck: Normal range of motion.  Pulmonary/Chest: Effort normal.  Musculoskeletal:  Left knee.  Pos patellar apprehension.  Markedly tender medial plica.  Range of motion 0-80 degrees with pain.  Cruciate and collateral ligaments stable.  Some swelling without significant effusion.  Mild joint line  tenderness.  Calf nt, nvi.    Neurological: She is alert and oriented to person, place, and time.  Skin: Skin is warm and dry.  Psychiatric: She has a normal mood and affect.    ED Course  Injection of joint Date/Time: 07/16/2012 3:29 PM Performed by: Zonia Kief Authorized by: Bradd Canary D Consent: Verbal consent obtained. Risks and benefits: risks, benefits and alternatives were discussed Consent given by: patient Patient understanding: patient states understanding of the procedure being performed Patient consent: the patient's understanding of the procedure matches consent given Test results: test results available and properly labeled Site marked: the operative site was marked Imaging studies: imaging studies available Required items: required blood products, implants, devices, and special equipment available Patient identity confirmed: verbally with  patient Preparation: Patient was prepped and draped in the usual sterile fashion. Local anesthesia used: yes Local anesthetic: lidocaine 1% without epinephrine Anesthetic total: 2 ml Patient sedated: no Patient tolerance: Patient tolerated the procedure well with no immediate complications. Comments: After using xylocaine for local anesthetic, a marcaine/depomedrol 80mg  6:1 injection was performed.  Patient had near complete relief after sitting for a few minutes and had full knee range of motion.     (including critical care time)  Labs Reviewed - No data to display Dg Knee Complete 4 Views Left  07/16/2012   *RADIOLOGY REPORT*  Clinical Data: Left knee pain, no known injury  LEFT KNEE - COMPLETE 4+ VIEW  Comparison: None  Findings: There is no fracture or dislocation of the left knee.  No joint effusion.  IMPRESSION: Normal exam of the knee.   Original Report Authenticated By: Genevive Bi, M.D.     1. Plica syndrome of knee, left   2. Chondromalacia patella, left       MDM  Patient had good relief with todays injection.  May use otc ibuprofen prn.  Will f/u with dr Mckinley Jewel in one week for recheck.  Advised that she may need further workup with an mri scan in the future.  She voices understanding.         Zonia Kief, PA-C 07/16/12 1535

## 2012-07-16 NOTE — ED Notes (Signed)
Right knee pain, states no injury, states woke this a.m. In pain

## 2012-07-16 NOTE — ED Provider Notes (Signed)
Medical screening examination/treatment/procedure(s) were performed by resident physician or non-physician practitioner and as supervising physician I was immediately available for consultation/collaboration.   Wilver Tignor DOUGLAS MD.   Rochanda Harpham D Lilias Lorensen, MD 07/16/12 1746 

## 2012-12-11 ENCOUNTER — Encounter (HOSPITAL_COMMUNITY): Payer: Self-pay | Admitting: Emergency Medicine

## 2012-12-11 ENCOUNTER — Emergency Department (HOSPITAL_COMMUNITY)
Admission: EM | Admit: 2012-12-11 | Discharge: 2012-12-11 | Disposition: A | Discharge status: Home / Self Care | Payer: BC Managed Care – PPO | Source: Home / Self Care

## 2012-12-11 DIAGNOSIS — J029 Acute pharyngitis, unspecified: Secondary | ICD-10-CM

## 2012-12-11 MED ORDER — IPRATROPIUM BROMIDE 0.06 % NA SOLN: 2.0000 | Freq: Four times a day (QID) | NASAL | Status: DC

## 2012-12-11 MED ORDER — PREDNISONE 5 MG PO KIT: PACK | ORAL | Status: AC

## 2012-12-11 NOTE — ED Provider Notes (Signed)
Rachel Patel is a 27 y.o. female who presents to Urgent Care today for sore throat worse with eating and drinking starting yesterday. She's been exposed to a small child and recently had strep throat. She denies any nausea vomiting diarrhea cough or congestion. She feels well otherwise. No fevers or chills.   Past Medical History  Diagnosis Date  . Asthma     during pregnancy  . Hypertension     during pregnancy   History  Substance Use Topics  . Smoking status: Current Every Day Smoker -- 0.25 packs/day    Types: Cigarettes  . Smokeless tobacco: Never Used  . Alcohol Use: No   ROS as above Medications reviewed. No current facility-administered medications for this encounter.   Current Outpatient Prescriptions  Medication Sig Dispense Refill  . cetirizine (ZYRTEC) 10 MG tablet Take 10 mg by mouth daily as needed for allergies.      Marland Kitchen ibuprofen (ADVIL,MOTRIN) 200 MG tablet Take 400 mg by mouth every 6 (six) hours as needed for pain.      Marland Kitchen ipratropium (ATROVENT) 0.06 % nasal spray Place 2 sprays into both nostrils 4 (four) times daily.  15 mL  1  . PredniSONE 5 MG KIT 6 day kit po  1 kit  0    Exam:  BP 107/74  Pulse 74  Temp(Src) 98.4 F (36.9 C) (Oral)  Resp 18  SpO2 100%  LMP 12/08/2012  Breastfeeding? No Gen: Well NAD HEENT: EOMI,  MMM, mildly erythematous posterior pharynx. Tympanic membranes are normal appearing bilaterally Lungs: CTABL Nl WOB Heart: RRR no MRG Abd: NABS, NT, ND Exts: Non edematous BL  LE, warm and well perfused.   Results for orders placed during the hospital encounter of 12/11/12 (from the past 24 hour(s))  POCT RAPID STREP A ([facility] ONLY)     Status: None   Collection Time    12/11/12  3:12 PM      Result Value Range   Streptococcus, Group A Screen (Direct) NEGATIVE  NEGATIVE   No results found.  Assessment and Plan: 27 y.o. female with viral pharyngitis. Strep throat culture pending. Plan to treat with prednisone Atrovent  nasal spray as well as Tylenol.  Followup as needed.  Discussed warning signs or symptoms. Please see discharge instructions. Patient expresses understanding.      Rodolph Bong, MD 12/11/12 [phone]

## 2012-12-11 NOTE — ED Notes (Signed)
C/o sore throat which started yesterday morning. Gargling with warm salt water was used as treatment

## 2012-12-13 LAB — CULTURE, GROUP A STREP

## 2013-01-25 NOTE — L&D Delivery Note (Addendum)
Delivery Note At 4:57 PM a viable female, "Rachel Patel", was delivered via Vaginal, Spontaneous Delivery (Presentation: ; Occiput Anterior).  APGAR: 8, 9; weight 4 lb 5.5 oz (1970 g).   Placenta status: Intact, Spontaneous.  Cord: 3 vessels with the following complications: Occult prolapse  Cord pH: NA  Anesthesia: None.  Received Fentanyl IV at 1551 Episiotomy: None Lacerations: None Suture Repair: None Est. Blood Loss (mL): 200  Mom to AICU.  Baby to Couplet care / Skin to Skin. Adoptive mom doing skin to skin. Adoptive family plans outpatient circumcision. Will continue magnesium sulfate for 24 hours. Mag level and PIH labs in the am. Placenta to pathology.  Patient for BTL tomorrow. Keep NPO after MN, maintain epidural cath. Dr. Su Hiltoberts will post case.  Rachel Patel, Rachel Patel 01/11/2014, 5:34 PM

## 2013-09-20 LAB — OB RESULTS CONSOLE HGB/HCT, BLOOD
HCT: 33 %
Hemoglobin: 11.1 g/dL

## 2013-09-20 LAB — OB RESULTS CONSOLE RUBELLA ANTIBODY, IGM: Rubella: NON-IMMUNE/NOT IMMUNE

## 2013-09-20 LAB — OB RESULTS CONSOLE RPR: RPR: NONREACTIVE

## 2013-09-20 LAB — OB RESULTS CONSOLE GC/CHLAMYDIA
CHLAMYDIA, DNA PROBE: NEGATIVE
Gonorrhea: NEGATIVE

## 2013-09-20 LAB — OB RESULTS CONSOLE PLATELET COUNT: Platelets: 309 10*3/uL

## 2013-09-20 LAB — OB RESULTS CONSOLE HEPATITIS B SURFACE ANTIGEN: Hepatitis B Surface Ag: NEGATIVE

## 2013-09-20 LAB — OB RESULTS CONSOLE ABO/RH: RH TYPE: POSITIVE

## 2013-09-20 LAB — OB RESULTS CONSOLE ANTIBODY SCREEN: Antibody Screen: NEGATIVE

## 2013-11-20 LAB — OB RESULTS CONSOLE RPR: RPR: NONREACTIVE

## 2013-11-20 LAB — OB RESULTS CONSOLE HIV ANTIBODY (ROUTINE TESTING): HIV: NONREACTIVE

## 2013-11-26 ENCOUNTER — Encounter (HOSPITAL_COMMUNITY): Payer: Self-pay | Admitting: Emergency Medicine

## 2013-12-08 ENCOUNTER — Emergency Department (HOSPITAL_COMMUNITY)
Admission: EM | Admit: 2013-12-08 | Discharge: 2013-12-08 | Disposition: A | Discharge status: Home / Self Care | Payer: Medicaid Other | Source: Home / Self Care | Attending: Family Medicine | Admitting: Family Medicine

## 2013-12-08 ENCOUNTER — Encounter (HOSPITAL_COMMUNITY): Payer: Self-pay | Admitting: Emergency Medicine

## 2013-12-08 DIAGNOSIS — H9201 Otalgia, right ear: Secondary | ICD-10-CM

## 2013-12-08 MED ORDER — NEOMYCIN-POLYMYXIN-HC 3.5-10000-1 OT SUSP: 4.0000 [drp] | Freq: Three times a day (TID) | OTIC | Status: AC

## 2013-12-08 NOTE — ED Notes (Signed)
Pt states that she has had right ear pain since Thursday 12/06/2013. Pt states that she placed a q-tip in her ear and a bloody white discharge came out. Pt is [redacted] weeks pregnant

## 2013-12-08 NOTE — ED Provider Notes (Signed)
Rachel Patel is a 28 y.o. female who presents to Urgent Care today for Right ear pain. Patient has a 2 day history of right ear pain. She notes some ear drainage and occasional blood on the Q-tip. She denies any pressure or difficulty hearing. No fevers or chills nausea vomiting or diarrhea. Patient is a G4 P3 woman at [redacted] weeks pregnant. Her pregnancy is complicated by pregnancy-induced hypertension.   Past Medical History  Diagnosis Date  . Asthma     during pregnancy  . Hypertension     during pregnancy   Past Surgical History  Procedure Laterality Date  . No past surgeries     History  Substance Use Topics  . Smoking status: Current Every Day Smoker -- 0.25 packs/day    Types: Cigarettes  . Smokeless tobacco: Never Used  . Alcohol Use: No   ROS as above Medications: No current facility-administered medications for this encounter.   Current Outpatient Prescriptions  Medication Sig Dispense Refill  . cetirizine (ZYRTEC) 10 MG tablet Take 10 mg by mouth daily as needed for allergies.    Marland Kitchen ibuprofen (ADVIL,MOTRIN) 200 MG tablet Take 400 mg by mouth every 6 (six) hours as needed for pain.    Marland Kitchen ipratropium (ATROVENT) 0.06 % nasal spray Place 2 sprays into both nostrils 4 (four) times daily. 15 mL 1  . neomycin-polymyxin-hydrocortisone (CORTISPORIN) 3.5-10000-1 otic suspension Place 4 drops into the right ear 3 (three) times daily. 10 mL 0  . PredniSONE 5 MG KIT 6 day kit po 1 kit 0   Allergies  Allergen Reactions  . Codeine Anaphylaxis and Swelling     Exam:  BP 177/77 mmHg  Pulse 93  Temp(Src) 97.8 F (36.6 C) (Oral)  Resp 20  SpO2 97% Gen: Well NAD HEENT: EOMI,  MMM right ear canal is mildly erythematous. Tympanic membranes normal. Left tympanic membranes normal. Posterior pharynx is normal. Mastoids are nontender bilaterally. Lungs: Normal work of breathing. CTABL Heart: RRR no MRG Abd: gravid Exts: Brisk capillary refill, warm and well perfused.   No results  found for this or any previous visit (from the past 24 hour(s)). No results found.  Assessment and Plan: 28 y.o. female with otitis externa possibly. Treatment with Cortisporin drops. Tylenol as needed.  Discussed warning signs or symptoms. Please see discharge instructions. Patient expresses understanding.     Gregor Hams, MD 12/08/13 [phone]

## 2013-12-08 NOTE — Discharge Instructions (Signed)
Thank you for coming in today. Use tylenol for pain as needed.  Use the ear drops for a few days.  Follow up with your doctor.  Please quit smoking.    Ear Drops You have been diagnosed with a condition requiring you to put drops of medicine into your outer ear. HOME CARE INSTRUCTIONS   Put drops in the affected ear as instructed. After putting the drops in, you will need to lie down with the affected ear facing up for ten minutes so the drops will remain in the ear canal and run down and fill the canal. Continue using the ear drops for as long as directed by your health care provider.  Prior to getting up, put a cotton ball gently in your ear canal. Leave enough of the cotton ball out so it can be easily removed. Do not attempt to push this down into the canal with a cotton-tipped swab or other instrument.  Do not irrigate or wash out your ears if you have had a perforated eardrum or mastoid surgery, or unless instructed to do so by your health care provider.  Keep appointments with your health care provider as instructed.  Finish all medicine, or use for the length of time prescribed by your health care provider. Continue the drops even if your problem seems to be doing well after a couple days, or continue as instructed. SEEK MEDICAL CARE IF:  You become worse or develop increasing pain.  You notice any unusual drainage from your ear (particularly if the drainage has a bad smell).  You develop hearing difficulties.  You experience a serious form of dizziness in which you feel as if the room is spinning, and you feel nauseated (vertigo).  The outside of your ear becomes red or swollen or both. This may be a sign of an allergic reaction. MAKE SURE YOU:   Understand these instructions.  Will watch your condition.  Will get help right away if you are not doing well or get worse. Document Released: 01/05/2001 Document Revised: 01/16/2013 Document Reviewed: 08/08/2012 [facility]  Patient Information 2015 [employer], [place]. This information is not intended to replace advice given to you by your health care provider. Make sure you discuss any questions you have with your health care provider.

## 2014-01-08 ENCOUNTER — Encounter (HOSPITAL_COMMUNITY): Payer: Self-pay | Admitting: General Practice

## 2014-01-08 ENCOUNTER — Inpatient Hospital Stay (HOSPITAL_COMMUNITY)
Admission: AD | Admit: 2014-01-08 | Discharge: 2014-01-13 | DRG: 767 | Disposition: A | Discharge status: Home / Self Care | Payer: Medicaid Other | Source: Ambulatory Visit | Attending: Obstetrics and Gynecology | Admitting: Obstetrics and Gynecology

## 2014-01-08 DIAGNOSIS — Z3A36 36 weeks gestation of pregnancy: Secondary | ICD-10-CM | POA: Diagnosis present

## 2014-01-08 DIAGNOSIS — F1290 Cannabis use, unspecified, uncomplicated: Secondary | ICD-10-CM | POA: Diagnosis present

## 2014-01-08 DIAGNOSIS — Z8249 Family history of ischemic heart disease and other diseases of the circulatory system: Secondary | ICD-10-CM | POA: Diagnosis not present

## 2014-01-08 DIAGNOSIS — O4103X0 Oligohydramnios, third trimester, not applicable or unspecified: Secondary | ICD-10-CM | POA: Diagnosis present

## 2014-01-08 DIAGNOSIS — O99323 Drug use complicating pregnancy, third trimester: Secondary | ICD-10-CM | POA: Diagnosis present

## 2014-01-08 DIAGNOSIS — O1403 Mild to moderate pre-eclampsia, third trimester: Principal | ICD-10-CM | POA: Diagnosis present

## 2014-01-08 DIAGNOSIS — O9912 Other diseases of the blood and blood-forming organs and certain disorders involving the immune mechanism complicating childbirth: Secondary | ICD-10-CM | POA: Diagnosis present

## 2014-01-08 DIAGNOSIS — O1490 Unspecified pre-eclampsia, unspecified trimester: Secondary | ICD-10-CM | POA: Diagnosis present

## 2014-01-08 DIAGNOSIS — Z302 Encounter for sterilization: Secondary | ICD-10-CM

## 2014-01-08 DIAGNOSIS — O163 Unspecified maternal hypertension, third trimester: Secondary | ICD-10-CM

## 2014-01-08 DIAGNOSIS — O99334 Smoking (tobacco) complicating childbirth: Secondary | ICD-10-CM | POA: Diagnosis present

## 2014-01-08 DIAGNOSIS — IMO0002 Reserved for concepts with insufficient information to code with codable children: Secondary | ICD-10-CM

## 2014-01-08 DIAGNOSIS — O1493 Unspecified pre-eclampsia, third trimester: Secondary | ICD-10-CM

## 2014-01-08 DIAGNOSIS — O365930 Maternal care for other known or suspected poor fetal growth, third trimester, not applicable or unspecified: Secondary | ICD-10-CM | POA: Diagnosis present

## 2014-01-08 DIAGNOSIS — J45909 Unspecified asthma, uncomplicated: Secondary | ICD-10-CM | POA: Diagnosis present

## 2014-01-08 DIAGNOSIS — Z3490 Encounter for supervision of normal pregnancy, unspecified, unspecified trimester: Secondary | ICD-10-CM

## 2014-01-08 DIAGNOSIS — D696 Thrombocytopenia, unspecified: Secondary | ICD-10-CM | POA: Diagnosis present

## 2014-01-08 DIAGNOSIS — O1400 Mild to moderate pre-eclampsia, unspecified trimester: Secondary | ICD-10-CM | POA: Diagnosis present

## 2014-01-08 DIAGNOSIS — O4100X0 Oligohydramnios, unspecified trimester, not applicable or unspecified: Secondary | ICD-10-CM | POA: Diagnosis present

## 2014-01-08 HISTORY — DX: Gestational (pregnancy-induced) hypertension without significant proteinuria, unspecified trimester: O13.9

## 2014-01-08 LAB — CBC
HCT: 31.3 % — ABNORMAL LOW (ref 36.0–46.0)
Hemoglobin: 10.7 g/dL — ABNORMAL LOW (ref 12.0–15.0)
MCH: 30 pg (ref 26.0–34.0)
MCHC: 34.2 g/dL (ref 30.0–36.0)
MCV: 87.7 fL (ref 78.0–100.0)
Platelets: 189 10*3/uL (ref 150–400)
RBC: 3.57 MIL/uL — AB (ref 3.87–5.11)
RDW: 12.9 % (ref 11.5–15.5)
WBC: 9.4 10*3/uL (ref 4.0–10.5)

## 2014-01-08 LAB — TYPE AND SCREEN
ABO/RH(D): O POS
Antibody Screen: NEGATIVE

## 2014-01-08 LAB — HIV ANTIBODY (ROUTINE TESTING W REFLEX): HIV 1&2 Ab, 4th Generation: NONREACTIVE

## 2014-01-08 LAB — RPR

## 2014-01-08 MED ORDER — LACTATED RINGERS IV BOLUS (SEPSIS)
500.0000 mL | Freq: Once | INTRAVENOUS | Status: AC
  Administered 2014-01-08: 500 mL via INTRAVENOUS

## 2014-01-08 MED ORDER — ACETAMINOPHEN 325 MG PO TABS
650.0000 mg | ORAL_TABLET | ORAL | Status: DC | PRN
  Administered 2014-01-10 (×2): 650 mg via ORAL
  Filled 2014-01-08 (×2): qty 2

## 2014-01-08 MED ORDER — PRENATAL MULTIVITAMIN CH
1.0000 | ORAL_TABLET | Freq: Every day | ORAL | Status: DC
  Administered 2014-01-09 – 2014-01-10 (×2): 1 via ORAL
  Filled 2014-01-08 (×2): qty 1

## 2014-01-08 MED ORDER — ZOLPIDEM TARTRATE 5 MG PO TABS: 5.0000 mg | ORAL_TABLET | Freq: Every evening | ORAL | Status: DC | PRN

## 2014-01-08 MED ORDER — CALCIUM CARBONATE ANTACID 500 MG PO CHEW: 2.0000 | CHEWABLE_TABLET | ORAL | Status: DC | PRN

## 2014-01-08 MED ORDER — LABETALOL HCL 5 MG/ML IV SOLN
10.0000 mg | INTRAVENOUS | Status: DC | PRN
  Administered 2014-01-11: 20 mg via INTRAVENOUS
  Administered 2014-01-11: 10 mg via INTRAVENOUS
  Filled 2014-01-08: qty 8
  Filled 2014-01-08 (×2): qty 4

## 2014-01-08 MED ORDER — SODIUM CHLORIDE 0.9 % IJ SOLN
3.0000 mL | Freq: Two times a day (BID) | INTRAMUSCULAR | Status: DC
  Administered 2014-01-08 – 2014-01-11 (×6): 3 mL via INTRAVENOUS

## 2014-01-08 MED ORDER — DOCUSATE SODIUM 100 MG PO CAPS
100.0000 mg | ORAL_CAPSULE | Freq: Every day | ORAL | Status: DC
  Administered 2014-01-09 – 2014-01-11 (×2): 100 mg via ORAL
  Filled 2014-01-08 (×3): qty 1

## 2014-01-08 NOTE — H&P (Signed)
Rachel Patel is a 28 y.o. female, G5P3013 at 35.5 weeks, presenting to Antenatal for extended monitoring and MFM consult for IUGR and mild PreEclampsia.  Patient seen in the office today, 01/08/2014, and was noted as having an out of norm (for patient) blood pressure and +2 protein in her urine.  Patient BPP  8/8 with infant EFW of 4lbs 11oz (4%tile), AFI 8.11cm, and dopplers normal.  PCR ratio 0.49 with all other labs WNR.  Patient reports mild headache, but has not attempted to treat with OTC medications.  Patient with h/o HTN, PreEclampsia, and SGA in previous pregnancies.   Patient GBS collected today and patient to give baby up for adoption to sister in law.    Patient Active Problem List   Diagnosis Date Noted  . IUGR (intrauterine growth restriction) 01/08/2014  . Mild preeclampsia 01/08/2014  . Pregnancy with adoption planned 01/08/2014    History of present pregnancy: Patient entered care at late 22.5 weeks.   EDC of 02/07/2013 was established by Definite LMP of 05/03/2013 and c/w Anatomy US.   Anatomy scan:  22.4 weeks, with normal findings and an anterior placenta.   Additional US evaluations:   -Anatomy:  EFW 521g, linear growth, cervix 4.76cm, normal vertical AP pocket, female. No apparent anomalies.    -35.5wks: Growth S<D: EFW 2141 4lbs 11oz (4%tile), AFI 8.11cm (15%tile), Symmetric growth--breathing observed. BPP 8/8 Significant prenatal events:  Desires BTL--papers signed and valid. C/O HA and backpain. Vaginal discharge.  Ear pain that she received medication for despite no infection diagnosis.  C/O abdominal tightening.  Influenza and Tdap given Last evaluation:  01/08/2014 by L.Montez Moritaarter, FNP---1/Thick/Ballotable.  FHR 145.  BP 130/80, 150/90, 160/90.  Wt 152lbs  OB History    Gravida Para Term Preterm AB TAB SAB Ectopic Multiple Living   5 3 3  1 1    3     -06/1996: 5lb 9oz Female infant at 7438wks via SVD  -2002: TAB -01/2010: 5lb 6oz Female infant at 2838wks via SVD -02/2012: 5lb  11oz Female infant at 3140wks via SVD  Past Medical History  Diagnosis Date  . Asthma     during pregnancy  . Hypertension     during pregnancy   Past Surgical History  Procedure Laterality Date  . No past surgeries     Family History: family history includes Hypertension in her mother. There is no history of Other. Social History:  reports that she has been smoking Cigarettes.  She has been smoking about 0.50 packs per day. She has never used smokeless tobacco. She reports that she uses illicit drugs (Marijuana). She reports that she does not drink alcohol.  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  Patient with high school education.  FOB involvement unknown, but patient plans for an in family adoption to his (FOB) sister.     Prenatal Transfer Tool  Maternal Diabetes: No Genetic Screening: Not completed due to LTC Maternal Ultrasounds/Referrals: Abnormal:  Findings:   IUGR at 35.5wks. Normal Anatomy Fetal Ultrasounds or other Referrals:  Referred to Materal Fetal Medicine  Maternal Substance Abuse:  Yes:  Type: Marijuana Significant Maternal Medications:  Meds include: Other:  Significant Maternal Lab Results: Lab values include: Other: GBS Unknown    ROS:  Contractions, Fetal Movement.  Patient denies LOF and VB.  Patient reports mild HA.  All other systems negative.   Allergies  Allergen Reactions  . Codeine Anaphylaxis and Swelling       not currently breastfeeding. Physical Exam  General appearance - alert, well appearing, and in no distress and short stature  Mental Status - alert, oriented to person, place, and time  Chest - clear to auscultation, no wheezes, rales or rhonchi, symmetric air entry   Heart - normal rate and regular rhythm   Abdomen - bowel sounds normal, Soft NT, Appears Gravid   Breasts - not examined   Pelvic - Deferred  Neurological - alert, oriented, normal speech, no focal findings or movement disorder noted   Musculoskeletal - no joint  tenderness, deformity or swelling, full range of motion without pain   Extremities - no pedal edema noted, intact peripheral pulses, feet normal, good pulses, normal color, temperature and sensation, Negative Clonus, +2 pitting edema in calves  Skin - normal coloration and turgor, no rashes, no suspicious skin lesions noted  FHR: 135 bpm, Mod Var, -Decels, +Accels UCs:  None palpated  Prenatal labs: ABO, Rh:  O Positive Antibody:  Negative Rubella:   Not Immune RPR:   NR HBsAg:   Negative HIV:   Negative GBS:  Unknown Sickle cell/Hgb electrophoresis:  Normal Pap:  Unknown GC:  Negative Chlamydia:  Negative Genetic screenings:  Negative Glucola:  Normal Other:  PC Ratio 0.49, UA 4.5, AST 10, ALT 5    Assessment IUP at 35.5wks Cat I FT IUGR Mild PreEclampsia Headache Smoker Desires Permanent Sterilization In Family Adoption  Plan: Admit to Antenatal per consult with Dr. Kathie RhodesS. Rivard Routine Antenatal Orders per [facility] Protocol MFM consult in AM SW consult in AM LR bolus Labs; Now-CBC, RPR, HIV, T&S -0800-CMP, UA, LDH Tylenol for HA IV labetalol orders per protocol Patient declines nicotine patch Continuous fetal monitoring  Phares Zaccone LYNNCNM, MSN 01/08/2014, 4:03 PM

## 2014-01-08 NOTE — Plan of Care (Signed)
Problem: Consults Goal: Birthing Suites Patient Information Press F2 to bring up selections list   Pt < [redacted] weeks EGA     

## 2014-01-09 ENCOUNTER — Inpatient Hospital Stay (HOSPITAL_COMMUNITY): Payer: Medicaid Other

## 2014-01-09 ENCOUNTER — Ambulatory Visit (HOSPITAL_COMMUNITY): Payer: Medicaid Other

## 2014-01-09 LAB — COMPREHENSIVE METABOLIC PANEL
ALK PHOS: 185 U/L — AB (ref 39–117)
ALT: 5 U/L (ref 0–35)
AST: 10 U/L (ref 0–37)
Albumin: 2.2 g/dL — ABNORMAL LOW (ref 3.5–5.2)
Anion gap: 13 (ref 5–15)
BILIRUBIN TOTAL: 0.4 mg/dL (ref 0.3–1.2)
BUN: 5 mg/dL — ABNORMAL LOW (ref 6–23)
CHLORIDE: 103 meq/L (ref 96–112)
CO2: 21 meq/L (ref 19–32)
Calcium: 7.9 mg/dL — ABNORMAL LOW (ref 8.4–10.5)
Creatinine, Ser: 0.49 mg/dL — ABNORMAL LOW (ref 0.50–1.10)
GFR calc non Af Amer: >90 mL/min (ref >90)
GLUCOSE: 72 mg/dL (ref 70–99)
Potassium: 3.8 mEq/L (ref 3.7–5.3)
Sodium: 137 mEq/L (ref 137–147)
Total Protein: 6 g/dL (ref 6.0–8.3)

## 2014-01-09 LAB — URIC ACID: Uric Acid, Serum: 3.9 mg/dL (ref 2.4–7.0)

## 2014-01-09 LAB — CBC
HEMATOCRIT: 32.5 % — AB (ref 36.0–46.0)
Hemoglobin: 11 g/dL — ABNORMAL LOW (ref 12.0–15.0)
MCH: 29.8 pg (ref 26.0–34.0)
MCHC: 33.8 g/dL (ref 30.0–36.0)
MCV: 88.1 fL (ref 78.0–100.0)
Platelets: 165 10*3/uL (ref 150–400)
RBC: 3.69 MIL/uL — ABNORMAL LOW (ref 3.87–5.11)
RDW: 12.9 % (ref 11.5–15.5)
WBC: 7.2 10*3/uL (ref 4.0–10.5)

## 2014-01-09 LAB — GROUP B STREP BY PCR: Group B strep by PCR: INVALID — AB

## 2014-01-09 IMAGING — US US OB COMP +14 WK
1 series · 12 of 28 positions shown · non-contrast
Comparison: none

[Series 1: us ob comp +14 wk · 0.21mm/px · 12 of 70 slices shown]
[im 3/70]
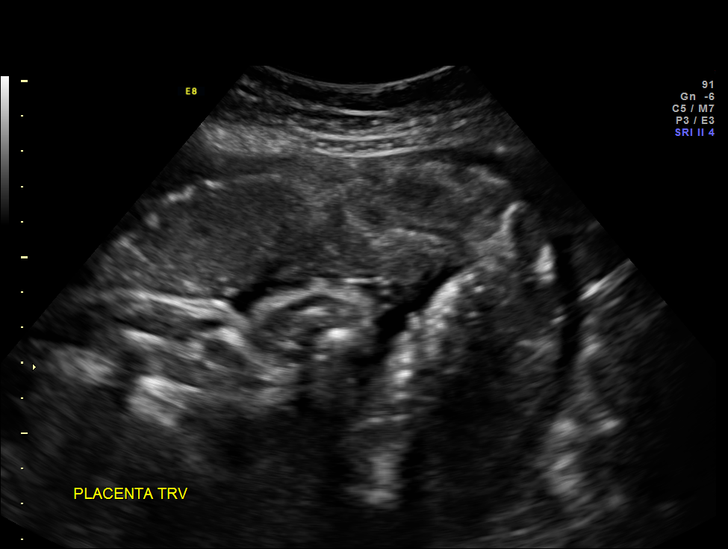
[im 8/70]
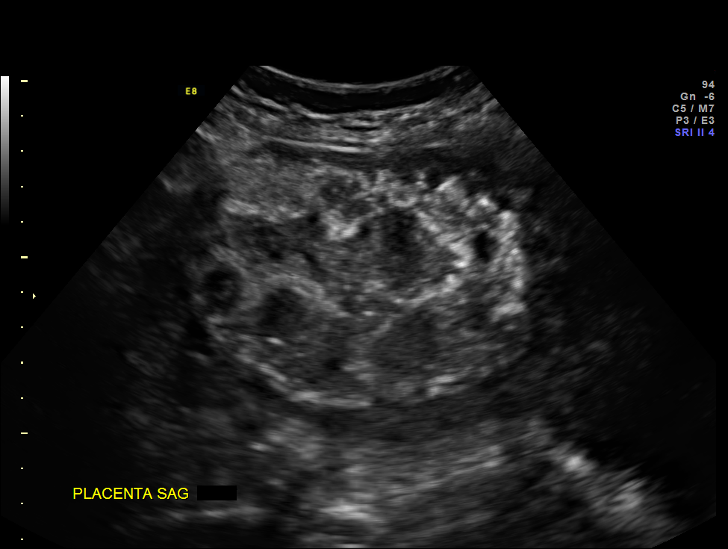
[im 13/70]
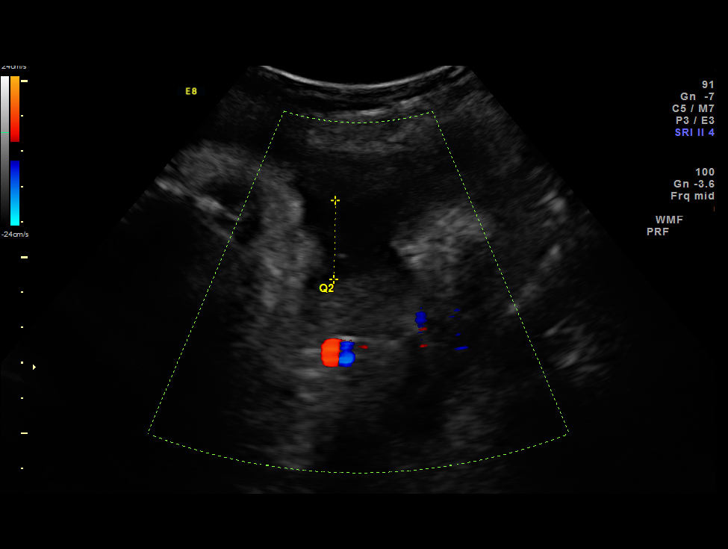
[im 21/70]
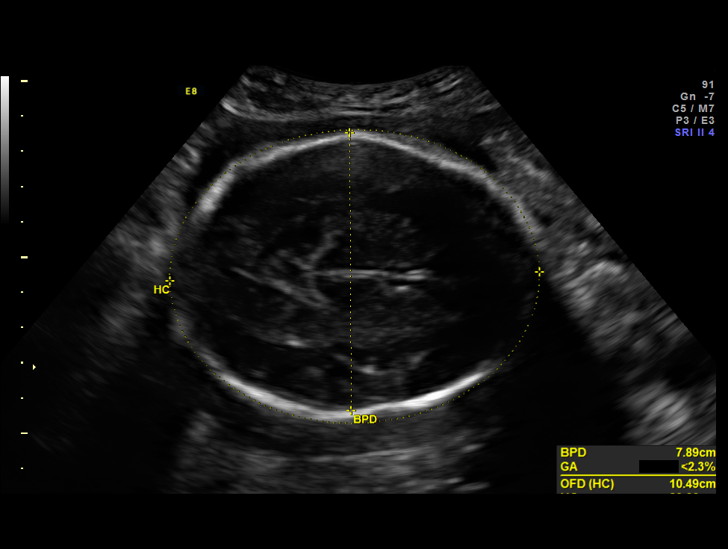
[im 26/70]
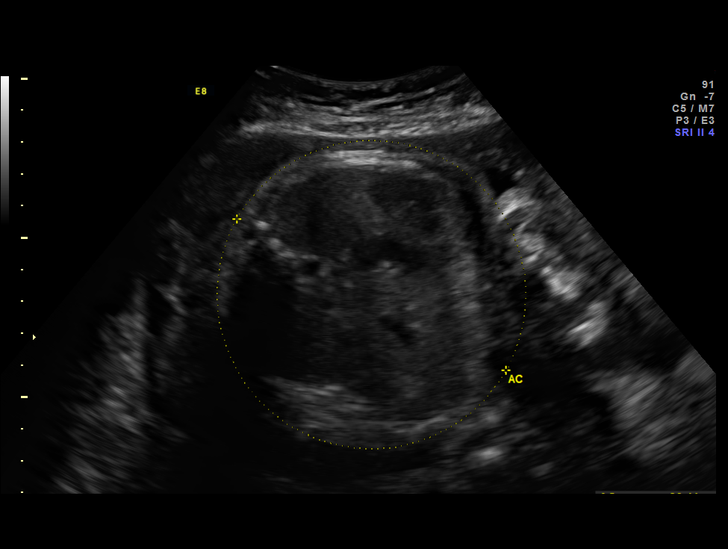
[im 31/70]
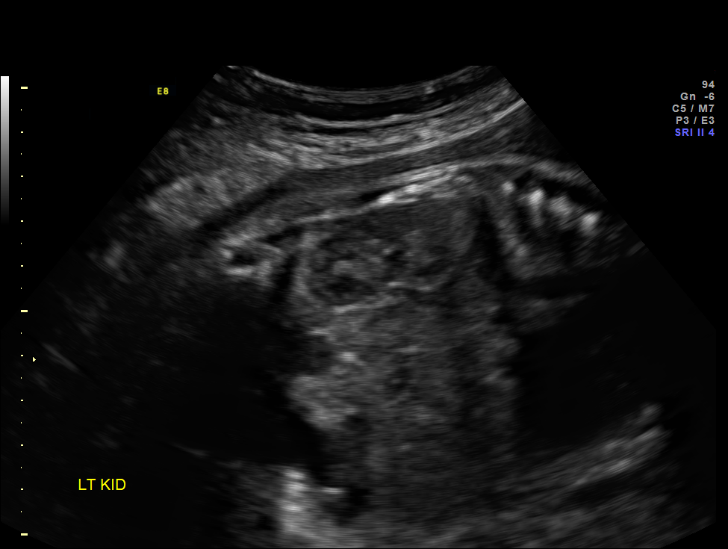
[im 39/70]
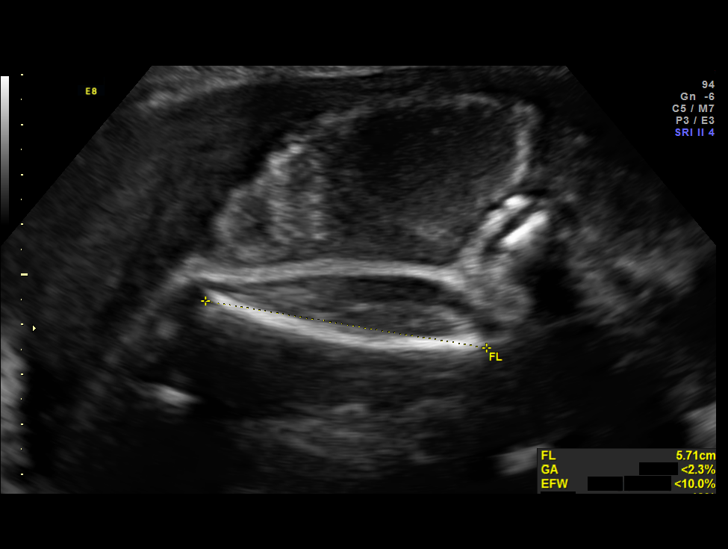
[im 44/70]
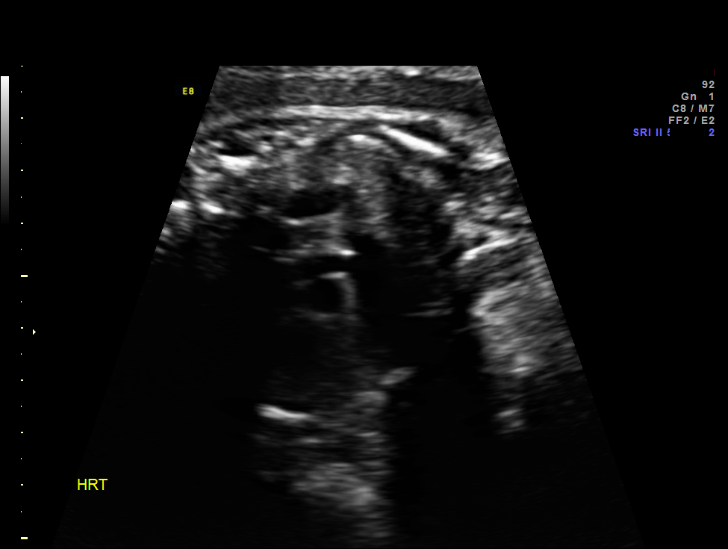
[im 49/70]
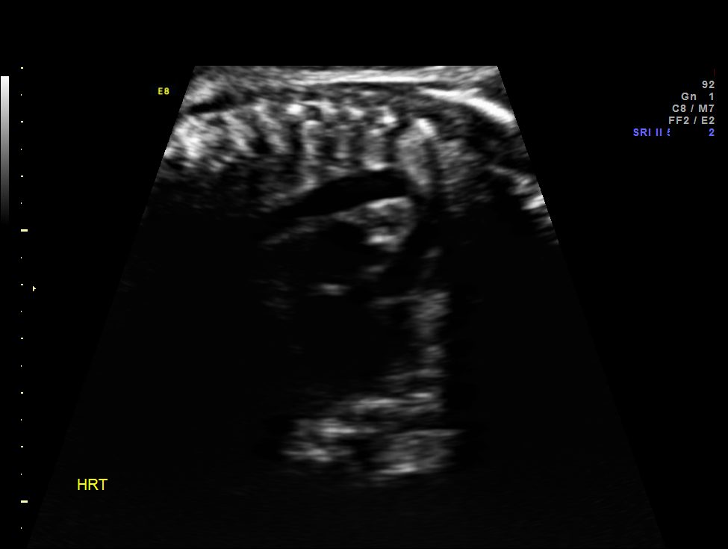
[im 57/70]
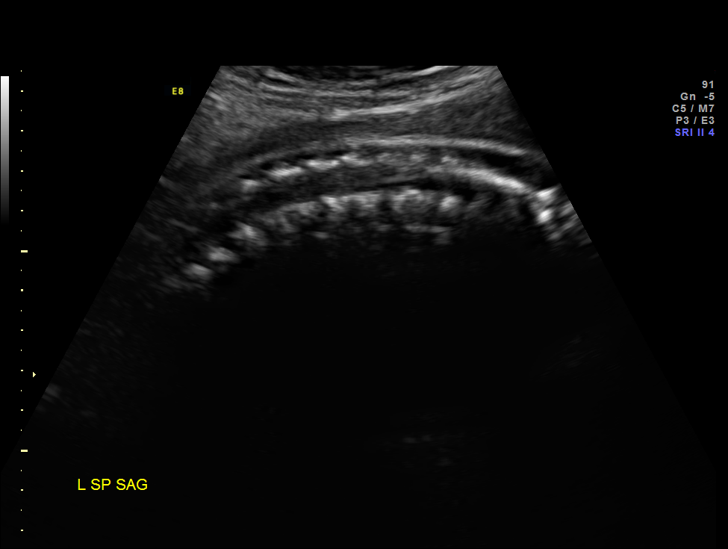
[im 62/70]
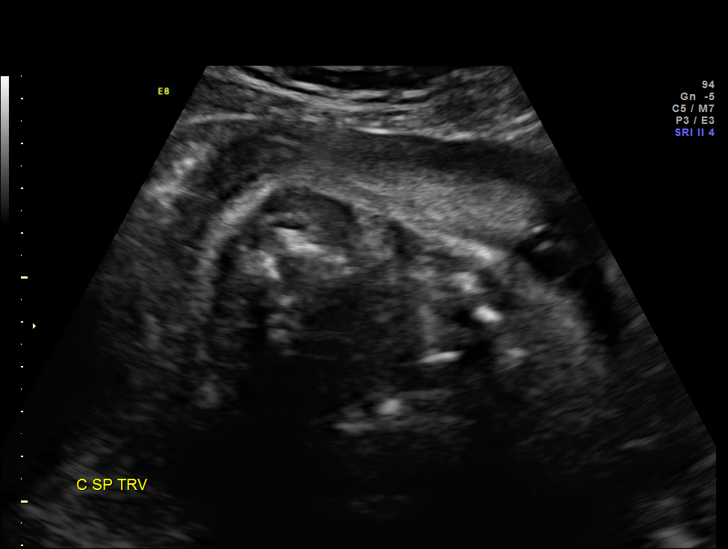
[im 67/70]
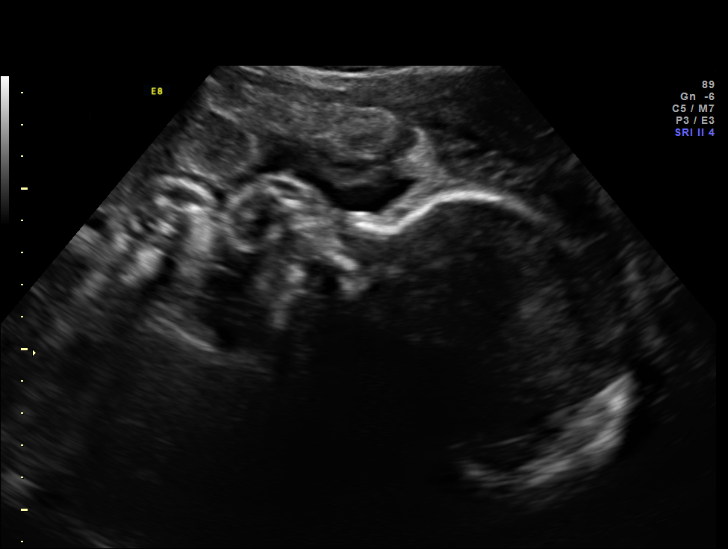

[12 of 28 positions shown; findings below may reference images not displayed]

OBSTETRICS REPORT
                      (Signed Final 01/09/2014 [DATE])

Service(s) Provided

 US OB COMP + 14 WK                                    76805.1
 US UA CORD DOPPLER                                    76820.0
Indications

 Basic anatomic survey                                 z36
 Pre-eclampsia
 Size less than dates (Small for gestational age,
 FGR)
 Poor obstetric history: Previous preeclampsia /
 eclampsia/gestational HTN
 Poor obstetric history: Previous fetal growth
 restriction (FGR)
 Cigarette smoker
 35 weeks gestation of pregnancy
Fetal Evaluation

 Num Of Fetuses:    1
 Fetal Heart Rate:  141                          bpm
 Cardiac Activity:  Observed
 Presentation:      Cephalic
 Placenta:          Anterior left, above
                    cervical os
 P. Cord            Visualized, central
 Insertion:

 Amniotic Fluid
 AFI FV:      Subjectively low-normal
 AFI Sum:     7.88    cm        6  %Tile     Larg Pckt:       4  cm
 RUQ:   4       cm   RLQ:    0      cm    LUQ:   2.24    cm   LLQ:    1.64   cm
Biophysical Evaluation

 Amniotic F.V:   Pocket => 2 cm two         F. Tone:        Observed
                 planes
 F. Movement:    Observed                   Score:          [DATE]
 F. Breathing:   Observed
Biometry

 BPD:     79.4  mm     G. Age:  31w 6d                CI:         75.0   70 - 86
 OFD:    105.9  mm                                    FL/HC:      19.6   20.1 -

 HC:     298.3  mm     G. Age:  33w 0d      < 3  %    HC/AC:      1.00   0.93 -

 AC:     299.2  mm     G. Age:  33w 6d       11  %    FL/BPD:     73.6   71 - 87
 FL:      58.4  mm     G. Age:  30w 4d      < 3  %    FL/AC:      19.5   20 - 24
 HUM:     56.7  mm     G. Age:  33w 0d       13  %
 CER:     39.1  mm     G. Age:  33w 1d       19  %

 Est. FW:    3433  gm      4 lb 7 oz   < 10  %
Gestational Age

 LMP:           35w 6d        Date:  05/03/13                 EDD:   02/07/14
 U/S Today:     32w 2d                                        EDD:   03/04/14
 Best:          35w 6d     Det. By:  LMP  (05/03/13)          EDD:   02/07/14
Anatomy

 Cranium:          Appears normal         Ductal Arch:      Appears normal
 Fetal Cavum:      Appears normal         Diaphragm:        Appears normal
 Ventricles:       Appears normal         Stomach:          Appears normal, left
                                                            sided
 Choroid Plexus:   Appears normal         Abdomen:          Appears normal
 Cerebellum:       Appears normal         Abdominal Wall:   Not well visualized
 Posterior Fossa:  Appears normal         Cord Vessels:     Appears normal (3
                                                            vessel cord)
 Nuchal Fold:      Not applicable (>20    Kidneys:          Appear normal
                   wks GA)
 Face:             Not well visualized    Bladder:          Appears normal
 Lips:             Not well visualized    Spine:            Appears normal
 Heart:            Not well visualized    Lower             Visualized
                                          Extremities:
 Aortic Arch:      Appears normal         Upper             Visualized
                                          Extremities:

 Other:  Technically difficult due to advanced GA and fetal position.
Targeted Anatomy

 Fetal Central Nervous System
 Cisterna Magna:
Doppler - Fetal Vessels

 Umbilical Artery
 S/D:   2.93           75  %tile       RI:
 PI:    1.05                           PSV:       48.45   cm/s
 Umbilical Artery
 Absent DFV:    No     Reverse DFV:    No

Cervix Uterus Adnexa

 Cervix:       Not visualized (advanced GA >85wks)
 Left Ovary:    Not visualized.
 Right Ovary:   Within normal limits.
 Adnexa:     No abnormality visualized.
Impression

 Single IUP at 35w 6d
 Preeclampsia without severe features
 The estimated fetal weight is < 10th %tile.
 Normal UA Doppler studies for gestational age
 BPP [DATE]
 Anterior, calcified placenta
 Amniotic fluid volume is subjectively decreased (AFI 7.8 cm)
Recommendations

 See separate consult note.
 Recommend repeat AFI in 48 hours due to subjectively
 decreased amniotic fluid volume

## 2014-01-09 NOTE — Consult Note (Signed)
Maternal Fetal Medicine Consultation  Requesting Provider(s): Silverio LaySandra Rivard, MD  Reason for consultation: Preeclampsia, IUGR - recommendations for management  HPI: Rachel Patel is a 28 yo G4P3003 currently at 35w 6d who was admitted yesterday due to preeclampsia and suspected fetal growth restriction.  The patient reports that she was noted to have elevated blood pressures during clinic visit yesterday - BPs throughout the course of her pregnancy had otherwise been normal.  Laboratory work up showed a PCR of 0.49.  The remainder of her preeclampsia labs were within normal limits.  Clinic ultrasound showed an estimated fetal weight < 10th %tile with normal UA Doppler studies.  Since admission, the patient has had some labile blood pressures - was 169/94 on admission - since then, had some intermittently elevated blood pressures, but none in the severe range.  The fetus is active. She reports that she had a mild headache yesterday that has resolved.  She denies visual changes or RUQ pain.  The patients past OB history is remarkable for preeclampsia with 2 of her previous deliveries - both induced at term without other complications.  OB History: OB History    Gravida Para Term Preterm AB TAB SAB Ectopic Multiple Living   5 3 3  1 1    3       PMH:  Past Medical History  Diagnosis Date  . Asthma     during pregnancy  . Hypertension     during pregnancy  . Pregnancy induced hypertension     PSH:  Past Surgical History  Procedure Laterality Date  . No past surgeries     Meds:  Scheduled Meds: . docusate sodium  100 mg Oral Daily  . prenatal multivitamin  1 tablet Oral Q1200  . sodium chloride  3 mL Intravenous Q12H   Continuous Infusions:  PRN Meds:.acetaminophen, calcium carbonate, labetalol, zolpidem   Allergies:  Allergies  Allergen Reactions  . Codeine Anaphylaxis and Swelling   FH:  Family History  Problem Relation Age of Onset  . Other Neg Hx   . Hypertension  Mother     PREECLAMPSIA   Soc:  History   Social History  . Marital Status: Single    Spouse Name: N/A    Number of Children: N/A  . Years of Education: N/A   Occupational History  . Not on file.   Social History Main Topics  . Smoking status: Current Every Day Smoker -- 0.50 packs/day    Types: Cigarettes  . Smokeless tobacco: Never Used  . Alcohol Use: No  . Drug Use: Yes    Special: Marijuana  . Sexual Activity: Yes    Birth Control/ Protection: None   Other Topics Concern  . Not on file   Social History Narrative    Review of Systems: no vaginal bleeding or cramping/contractions, no LOF, no nausea/vomiting. All other systems reviewed and are negative.  PE:   Filed Vitals:   01/09/14 0808  BP: 143/93  Pulse: 54  Temp: 98.2 F (36.8 C)  Resp: 18    GEN: well-appearing female ABD: gravid, NT  Ultrasound: Single IUP at 35w 6d Preeclampsia without severe features The estimated fetal weight is < 10th %tile. Normal UA Doppler studies for gestational age BPP 8/8 Anterior, calcified placenta Amniotic fluid volume is subjectively decreased (AFI 7.8 cm)  Labs: CBC    Component Value Date/Time   WBC 7.2 01/09/2014 0759   RBC 3.69* 01/09/2014 0759   HGB 11.0* 01/09/2014 0759   HGB 11.1  09/20/2013   HCT 32.5* 01/09/2014 0759   HCT 33 09/20/2013   PLT 165 01/09/2014 0759   PLT 309 09/20/2013   MCV 88.1 01/09/2014 0759   MCH 29.8 01/09/2014 0759   MCHC 33.8 01/09/2014 0759   RDW 12.9 01/09/2014 0759   CMP     Component Value Date/Time   NA 137 01/09/2014 0759   K 3.8 01/09/2014 0759   CL 103 01/09/2014 0759   CO2 21 01/09/2014 0759   GLUCOSE 72 01/09/2014 0759   BUN 5* 01/09/2014 0759   CREATININE 0.49* 01/09/2014 0759   CALCIUM 7.9* 01/09/2014 0759   PROT 6.0 01/09/2014 0759   ALBUMIN 2.2* 01/09/2014 0759   AST 10 01/09/2014 0759   ALT 5 01/09/2014 0759   ALKPHOS 185* 01/09/2014 0759   BILITOT 0.4 01/09/2014 0759   GFRNONAA >90 01/09/2014  0759   GFRAA >90 01/09/2014 0759     A/P: 1) Single IUP at 35w 6d         2) Preeclampsia without severe features         3) Fetal growth restriction - the estimated fetal weight today is < 10th %tile.  The patient underwent a 22 week ultrasound that was consistent with dates.  Recommendations: 1) Concur with admission.  Would recommend that the patient remain in house until delivery 2) Consider 24-hr urine protein collection 3) Would hold off on oral hypertension medications for now - if medications required, would use this as an indication to move toward delivery.  If the patient requires IV medications, would likewise use this an indication for delivery given the patient's current gestational age. 4) Repeat AFI in 48 hrs - if oligohydramnios noted (AFI < 5 cm), would move toward delivery at that time 5) At least daily NSTs or as clinically indicated. 6) Recommend delivery by 37 weeks or sooner if the patient develops severe features (SBP > 160 or DBP >110, LFTs > 2x normal, thrombocytopenia with plts < 100K, Creat > 1.1, severe headache or non-reassuring fetal status)    Thank you for the opportunity to be a part of the care of Delta Air LinesKatrice Koeneman. Please contact our office if we can be of further assistance.   I spent approximately 30 minutes with this patient with over 50% of time spent in face-to-face counseling.  Alpha GulaPaul Natonya Finstad, MD Maternal Fetal Medicine

## 2014-01-09 NOTE — Progress Notes (Signed)
This note also relates to the following rows which could not be included: BP - Cannot attach notes to unvalidated device data Pt informed a 24 hour urine will be started.  Given instructions regarding urine collection, pt verbalized understanding & agreeable.

## 2014-01-09 NOTE — Progress Notes (Signed)
Clinical Social Work Department BRIEF PSYCHOSOCIAL ASSESSMENT 01/09/2014  Patient:  Rachel Patel, Rachel Patel     Account Number:  [id number]     Admit date:  01/08/2014  Clinical Social Worker:  Barbarann Ehlers  Date/Time:  01/09/2014 02:45 PM  Referred by:  RN  Date Referred:  01/09/2014 Referred for  Adoption   Other Referral:   Interview type:  Family Other interview type:    PSYCHOSOCIAL DATA Living Status:  FAMILY Admitted from facility:   Level of care:   Primary support name:   Primary support relationship to patient:  PARTNER Degree of support available:   Patient's fiance was here with her today.  She states he is involved and supportive.  He is in agreement with adoption plan.    CURRENT CONCERNS Current Concerns  None Noted   Other Concerns:    SOCIAL WORK ASSESSMENT / PLAN CSW met with patient and her fiance, with whom she said we could talk about anything, to introduce CSW support services, evaluate coping and discuss plan for baby.  CSW provided information regarding the legal adoption process and what paperwork needs to be in place after delivery in order for the hospital to consider her plan a legal adoption.  CSW offered to speak with fiance's sister, who will be adopting the baby, if she has any questions while patient is in the hospital and provided contact information to patient.  CSW discussed patient's wishes for delivery and PP care since she is making an adoption plan.   Assessment/plan status:  Psychosocial Support/Ongoing Assessment of Needs Other assessment/ plan:   Information/referral to community resources:   CSW provided education regarding process of adoption    PATIENT'S/FAMILY'S RESPONSE TO PLAN OF CARE: Patient was very pleasant and welcomed CSW intervention. Her fiance was initially resting on the couch, but sat up and patient turned on the light when CSW arrived.  Patient reports she and her fiance (FOB) plan to allow FOB's sister to adopt the  baby at delivery.  She informed CSW that "she (FOB's sister) is handling all that (details of adoption)." Patient and fiance were very attentive to the process and what needs to be in place in order for the hospital to consider the situation a legal adoption.  They understand that if the legal paperwork is not in place by a lawyer, the hospital will not be able to discharge the baby to FOB's sister.  Patient was thankful for the information so not to be blindsided after delivery.  She states she would like FOB's sister to be the one to do skin to skin at baby's birth and then states that she will have the second bracelet.  She states FOB's sister plans to stay in the hospital with patient so that she can be with the baby. Patient and fiance state that they want contact with the baby and report that they will be involved in the child's life.  They are also content with the possibility that the baby may discharge into their care at discharge if the adoption paperwork is not in order at that time.  They state no further questions or needs of CSW and thanked CSW for the visit.

## 2014-01-09 NOTE — Progress Notes (Signed)
Hospital day # 1 pregnancy at 4971w6d--elevated BP and PCR.  Pt denies HA, vb lof, URQ pain, log  S:  Perception of contractions: none      Vaginal bleeding: none now       Vaginal discharge:  no significant change  O: BP 143/93 mmHg  Pulse 54  Temp(Src) 98.2 F (36.8 C) (Oral)  Resp 18  Ht 4\' 10"  (1.473 m)  Wt 152 lb 3.2 oz (69.037 kg)  BMI 31.82 kg/m2  LMP 05/03/2013      Fetal tracings:      Contractions:         Uterus gravid and non-tender      Extremities: no significant edema and no signs of DVT     MFM consult recommendation Recommendations: 1) Concur with admission. Would recommend that the patient remain in house until delivery 2) Consider 24-hr urine protein collection 3) Would hold off on oral hypertension medications for now - if medications required, would use this as an indication to move toward delivery. If the patient requires IV medications, would likewise use this an indication for delivery given the patient's current gestational age. 4) Repeat AFI in 48 hrs - if oligohydramnios noted (AFI < 5 cm), would move toward delivery at that time 5) At least daily NSTs or as clinically indicated. 6) Recommend delivery by 37 weeks or sooner if the patient develops severe features (SBP > 160 or DBP >110, LFTs > 2x normal, thrombocytopenia with plts < 100K, Creat > 1.1, severe headache or non-reassuring fetal status)       Labs:   Results for orders placed or performed during the hospital encounter of 01/08/14 (from the past 24 hour(s))  RPR     Status: None   Collection Time: 01/08/14  4:35 PM  Result Value Ref Range   RPR NON REAC NON REAC  HIV antibody     Status: None   Collection Time: 01/08/14  4:35 PM  Result Value Ref Range   HIV 1&2 Ab, 4th Generation NONREACTIVE NONREACTIVE  CBC     Status: Abnormal   Collection Time: 01/08/14  4:35 PM  Result Value Ref Range   WBC 9.4 4.0 - 10.5 K/uL   RBC 3.57 (L) 3.87 - 5.11 MIL/uL   Hemoglobin 10.7 (L) 12.0 - 15.0  g/dL   HCT 16.131.3 (L) 09.636.0 - 04.546.0 %   MCV 87.7 78.0 - 100.0 fL   MCH 30.0 26.0 - 34.0 pg   MCHC 34.2 30.0 - 36.0 g/dL   RDW 40.912.9 81.111.5 - 91.415.5 %   Platelets 189 150 - 400 K/uL  Type and screen     Status: None   Collection Time: 01/08/14  4:35 PM  Result Value Ref Range   ABO/RH(D) O POS    Antibody Screen NEG    Sample Expiration 01/11/2014   Comprehensive metabolic panel     Status: Abnormal   Collection Time: 01/09/14  7:59 AM  Result Value Ref Range   Sodium 137 137 - 147 mEq/L   Potassium 3.8 3.7 - 5.3 mEq/L   Chloride 103 96 - 112 mEq/L   CO2 21 19 - 32 mEq/L   Glucose, Bld 72 70 - 99 mg/dL   BUN 5 (L) 6 - 23 mg/dL   Creatinine, Ser 7.820.49 (L) 0.50 - 1.10 mg/dL   Calcium 7.9 (L) 8.4 - 10.5 mg/dL   Total Protein 6.0 6.0 - 8.3 g/dL   Albumin 2.2 (L) 3.5 - 5.2 g/dL  AST 10 0 - 37 U/L   ALT 5 0 - 35 U/L   Alkaline Phosphatase 185 (H) 39 - 117 U/L   Total Bilirubin 0.4 0.3 - 1.2 mg/dL   GFR calc non Af Amer >90 >90 mL/min   GFR calc Af Amer >90 >90 mL/min   Anion gap 13 5 - 15  Uric acid     Status: None   Collection Time: 01/09/14  7:59 AM  Result Value Ref Range   Uric Acid, Serum 3.9 2.4 - 7.0 mg/dL  CBC     Status: Abnormal   Collection Time: 01/09/14  7:59 AM  Result Value Ref Range   WBC 7.2 4.0 - 10.5 K/uL   RBC 3.69 (L) 3.87 - 5.11 MIL/uL   Hemoglobin 11.0 (L) 12.0 - 15.0 g/dL   HCT 04.532.5 (L) 40.936.0 - 81.146.0 %   MCV 88.1 78.0 - 100.0 fL   MCH 29.8 26.0 - 34.0 pg   MCHC 33.8 30.0 - 36.0 g/dL   RDW 91.412.9 78.211.5 - 95.615.5 %   Platelets 165 150 - 400 K/uL          Meds: Current facility-administered medications: acetaminophen (TYLENOL) tablet 650 mg, 650 mg, Oral, Q4H PRN, Gerrit HeckJessica Emly, CNM;  calcium carbonate (TUMS - dosed in mg elemental calcium) chewable tablet 400 mg of elemental calcium, 2 tablet, Oral, Q4H PRN, Gerrit HeckJessica Emly, CNM;  docusate sodium (COLACE) capsule 100 mg, 100 mg, Oral, Daily, Gerrit HeckJessica Emly, CNM, 100 mg at 01/09/14 1041 labetalol (NORMODYNE,TRANDATE)  injection 10-40 mg, 10-40 mg, Intravenous, Q10 min PRN, Gerrit HeckJessica Emly, CNM;  prenatal multivitamin tablet 1 tablet, 1 tablet, Oral, Q1200, Gerrit HeckJessica Emly, CNM;  sodium chloride 0.9 % injection 3 mL, 3 mL, Intravenous, Q12H, Gerrit HeckJessica Emly, CNM, 3 mL at 01/09/14 1041;  zolpidem (AMBIEN) tablet 5 mg, 5 mg, Oral, QHS PRN, Gerrit HeckJessica Emly, CNM  A: 3170w6d with pre eclampsia     stable     Fetal tracings: Category 1--baseline 130, moderate variables, +accels  noted      Contractions: none     Uterus non-tender      Extremities: DTR 1+, no clonus, no edema  P: Continue current plan of care      Upcoming tests/treatments:  US on Friday       Consult with Dr.  for plan on care      MDs will follow  Xia Stohr, CNM, MSN 01/09/2014. 11:39 AM

## 2014-01-10 ENCOUNTER — Other Ambulatory Visit (HOSPITAL_COMMUNITY): Payer: Self-pay

## 2014-01-10 LAB — CREATININE CLEARANCE, URINE, 24 HOUR
Collection Interval-CRCL: 24 hours
Creatinine Clearance: 150 mL/min — ABNORMAL HIGH (ref 75–115)
Creatinine, 24H Ur: 1058 mg/d (ref 700–1800)
Creatinine, Urine: 132.23 mg/dL
Creatinine: 0.49 mg/dL — ABNORMAL LOW (ref 0.50–1.10)
URINE TOTAL VOLUME-CRCL: 800 mL

## 2014-01-10 NOTE — Progress Notes (Addendum)
28 y.o. year old female,at 1634w0d gestation.  SUBJECTIVE:  Doing well. No headache, blurred vision, or RUQ tenderness.  OBJECTIVE:  BP 145/84 mmHg  Pulse 66  Temp(Src) 98.1 F (36.7 C) (Oral)  Resp 18  Ht 4\' 10"  (1.473 m)  Wt 152 lb 3.2 oz (69.037 kg)  BMI 31.82 kg/m2  LMP 05/03/2013  Fetal Heart Tones:  Reassuring FHR tracing  Contractions:          Few, mild  Chest: Clear Heart: RRR Abd: nontender Ext: no clonus, normal reflexes  Results for orders placed or performed during the hospital encounter of 01/08/14 (from the past 24 hour(s))  Group B strep by PCR     Status: Abnormal   Collection Time: 01/09/14  7:43 PM  Result Value Ref Range   Group B strep by PCR INVALID RESULTS, SPECIMEN SENT FOR CULTURE (A) NEGATIVE     ASSESSMENT:  4734w0d Weeks Pregnancy  PreEclampsia without severe features.  Low AFI   SGA/ ? IUGR  PLAN:  Check 24 hour urine protein.  US on 01/11/14.  Leonard SchwartzArthur Vernon Dantonio Justen, M.D.

## 2014-01-11 ENCOUNTER — Ambulatory Visit (HOSPITAL_COMMUNITY): Payer: Medicaid Other

## 2014-01-11 ENCOUNTER — Encounter (HOSPITAL_COMMUNITY): Payer: Self-pay | Admitting: *Deleted

## 2014-01-11 DIAGNOSIS — Z3A36 36 weeks gestation of pregnancy: Secondary | ICD-10-CM | POA: Diagnosis present

## 2014-01-11 DIAGNOSIS — O4100X0 Oligohydramnios, unspecified trimester, not applicable or unspecified: Secondary | ICD-10-CM | POA: Diagnosis present

## 2014-01-11 DIAGNOSIS — O1490 Unspecified pre-eclampsia, unspecified trimester: Secondary | ICD-10-CM | POA: Diagnosis present

## 2014-01-11 LAB — COMPREHENSIVE METABOLIC PANEL
ALBUMIN: 2.1 g/dL — AB (ref 3.5–5.2)
ALK PHOS: 187 U/L — AB (ref 39–117)
ALT: 5 U/L (ref 0–35)
ALT: 8 U/L (ref 0–35)
ANION GAP: 13 (ref 5–15)
AST: 13 U/L (ref 0–37)
AST: 23 U/L (ref 0–37)
Albumin: 1.9 g/dL — ABNORMAL LOW (ref 3.5–5.2)
Alkaline Phosphatase: 177 U/L — ABNORMAL HIGH (ref 39–117)
Anion gap: 10 (ref 5–15)
BILIRUBIN TOTAL: 0.3 mg/dL (ref 0.3–1.2)
BUN: 7 mg/dL (ref 6–23)
BUN: 7 mg/dL (ref 6–23)
CALCIUM: 7.8 mg/dL — AB (ref 8.4–10.5)
CHLORIDE: 104 meq/L (ref 96–112)
CHLORIDE: 101 meq/L (ref 96–112)
CO2: 22 meq/L (ref 19–32)
CO2: 21 meq/L (ref 19–32)
CREATININE: 0.52 mg/dL (ref 0.50–1.10)
CREATININE: 0.53 mg/dL (ref 0.50–1.10)
Calcium: 8 mg/dL — ABNORMAL LOW (ref 8.4–10.5)
GFR calc Af Amer: >90 mL/min (ref >90)
GFR calc non Af Amer: >90 mL/min (ref >90)
GLUCOSE: 71 mg/dL (ref 70–99)
Glucose, Bld: 106 mg/dL — ABNORMAL HIGH (ref 70–99)
POTASSIUM: 3.4 meq/L — AB (ref 3.7–5.3)
Potassium: 3.8 mEq/L (ref 3.7–5.3)
Sodium: 136 mEq/L — ABNORMAL LOW (ref 137–147)
Sodium: 135 mEq/L — ABNORMAL LOW (ref 137–147)
Total Bilirubin: 0.4 mg/dL (ref 0.3–1.2)
Total Protein: 5.1 g/dL — ABNORMAL LOW (ref 6.0–8.3)
Total Protein: 6.2 g/dL (ref 6.0–8.3)

## 2014-01-11 LAB — CBC
HCT: 29.4 % — ABNORMAL LOW (ref 36.0–46.0)
HCT: 33.1 % — ABNORMAL LOW (ref 36.0–46.0)
HEMOGLOBIN: 10.1 g/dL — AB (ref 12.0–15.0)
Hemoglobin: 11.2 g/dL — ABNORMAL LOW (ref 12.0–15.0)
MCH: 30.1 pg (ref 26.0–34.0)
MCH: 29.5 pg (ref 26.0–34.0)
MCHC: 34.4 g/dL (ref 30.0–36.0)
MCHC: 33.8 g/dL (ref 30.0–36.0)
MCV: 87.5 fL (ref 78.0–100.0)
MCV: 87.1 fL (ref 78.0–100.0)
Platelets: 141 10*3/uL — ABNORMAL LOW (ref 150–400)
Platelets: 153 10*3/uL (ref 150–400)
RBC: 3.36 MIL/uL — AB (ref 3.87–5.11)
RBC: 3.8 MIL/uL — ABNORMAL LOW (ref 3.87–5.11)
RDW: 13 % (ref 11.5–15.5)
RDW: 13.1 % (ref 11.5–15.5)
WBC: 7.2 10*3/uL (ref 4.0–10.5)
WBC: 12.7 10*3/uL — AB (ref 4.0–10.5)

## 2014-01-11 LAB — PROTEIN, URINE, 24 HOUR
Collection Interval-UPROT: 24 hours
PROTEIN, URINE: 253 mg/dL — AB (ref 5–24)
Protein, 24H Urine: 2024 mg/d — ABNORMAL HIGH (ref <150)
Urine Total Volume-UPROT: 800 mL

## 2014-01-11 LAB — LACTATE DEHYDROGENASE: LDH: 253 U/L — AB (ref 94–250)

## 2014-01-11 LAB — CULTURE, BETA STREP (GROUP B ONLY)

## 2014-01-11 LAB — TYPE AND SCREEN
ABO/RH(D): O POS
ANTIBODY SCREEN: NEGATIVE

## 2014-01-11 LAB — GROUP B STREP BY PCR: Group B strep by PCR: NEGATIVE

## 2014-01-11 LAB — URIC ACID: Uric Acid, Serum: 4 mg/dL (ref 2.4–7.0)

## 2014-01-11 LAB — MRSA PCR SCREENING: MRSA by PCR: NEGATIVE

## 2014-01-11 MED ORDER — DIPHENHYDRAMINE HCL 50 MG/ML IJ SOLN: 12.5000 mg | INTRAMUSCULAR | Status: DC | PRN

## 2014-01-11 MED ORDER — LIDOCAINE HCL (PF) 1 % IJ SOLN
30.0000 mL | INTRAMUSCULAR | Status: DC | PRN
  Filled 2014-01-11: qty 30

## 2014-01-11 MED ORDER — ONDANSETRON HCL 4 MG/2ML IJ SOLN: 4.0000 mg | INTRAMUSCULAR | Status: DC | PRN

## 2014-01-11 MED ORDER — LACTATED RINGERS IV SOLN
INTRAVENOUS | Status: DC
  Administered 2014-01-12: 02:00:00 via INTRAVENOUS

## 2014-01-11 MED ORDER — PHENYLEPHRINE 40 MCG/ML (10ML) SYRINGE FOR IV PUSH (FOR BLOOD PRESSURE SUPPORT)
80.0000 ug | PREFILLED_SYRINGE | INTRAVENOUS | Status: DC | PRN
  Filled 2014-01-11: qty 2

## 2014-01-11 MED ORDER — HYDRALAZINE HCL 20 MG/ML IJ SOLN
5.0000 mg | INTRAMUSCULAR | Status: DC | PRN
  Filled 2014-01-11: qty 1

## 2014-01-11 MED ORDER — FLEET ENEMA 7-19 GM/118ML RE ENEM: 1.0000 | ENEMA | RECTAL | Status: DC | PRN

## 2014-01-11 MED ORDER — ACETAMINOPHEN 325 MG PO TABS: 650.0000 mg | ORAL_TABLET | ORAL | Status: DC | PRN

## 2014-01-11 MED ORDER — LACTATED RINGERS IV SOLN
INTRAVENOUS | Status: DC
  Administered 2014-01-11: 16:00:00 via INTRAVENOUS

## 2014-01-11 MED ORDER — TETANUS-DIPHTH-ACELL PERTUSSIS 5-2.5-18.5 LF-MCG/0.5 IM SUSP
0.5000 mL | Freq: Once | INTRAMUSCULAR | Status: DC
  Filled 2014-01-11 (×2): qty 0.5

## 2014-01-11 MED ORDER — SENNOSIDES-DOCUSATE SODIUM 8.6-50 MG PO TABS
2.0000 | ORAL_TABLET | ORAL | Status: DC
  Administered 2014-01-12 (×2): 2 via ORAL
  Filled 2014-01-11 (×2): qty 2

## 2014-01-11 MED ORDER — EPHEDRINE 5 MG/ML INJ
10.0000 mg | INTRAVENOUS | Status: DC | PRN
  Filled 2014-01-11: qty 2

## 2014-01-11 MED ORDER — TERBUTALINE SULFATE 1 MG/ML IJ SOLN: 0.2500 mg | Freq: Once | INTRAMUSCULAR | Status: DC | PRN

## 2014-01-11 MED ORDER — MAGNESIUM SULFATE 40 G IN LACTATED RINGERS - SIMPLE
2.0000 g/h | INTRAVENOUS | Status: AC
  Administered 2014-01-12: 2 g/h via INTRAVENOUS
  Filled 2014-01-11: qty 500

## 2014-01-11 MED ORDER — DIPHENHYDRAMINE HCL 25 MG PO CAPS: 25.0000 mg | ORAL_CAPSULE | Freq: Four times a day (QID) | ORAL | Status: DC | PRN

## 2014-01-11 MED ORDER — IBUPROFEN 600 MG PO TABS
600.0000 mg | ORAL_TABLET | Freq: Four times a day (QID) | ORAL | Status: DC
  Administered 2014-01-11 – 2014-01-13 (×6): 600 mg via ORAL
  Filled 2014-01-11 (×6): qty 1

## 2014-01-11 MED ORDER — MISOPROSTOL 25 MCG QUARTER TABLET
25.0000 ug | ORAL_TABLET | ORAL | Status: DC | PRN
  Administered 2014-01-11: 25 ug via VAGINAL
  Filled 2014-01-11: qty 1
  Filled 2014-01-11: qty 0.25

## 2014-01-11 MED ORDER — FENTANYL CITRATE 0.05 MG/ML IJ SOLN
100.0000 ug | INTRAMUSCULAR | Status: DC | PRN
  Administered 2014-01-11: 100 ug via INTRAVENOUS
  Filled 2014-01-11: qty 2

## 2014-01-11 MED ORDER — ONDANSETRON HCL 4 MG PO TABS: 4.0000 mg | ORAL_TABLET | ORAL | Status: DC | PRN

## 2014-01-11 MED ORDER — BENZOCAINE-MENTHOL 20-0.5 % EX AERO: 1.0000 "application " | INHALATION_SPRAY | CUTANEOUS | Status: DC | PRN

## 2014-01-11 MED ORDER — ZOLPIDEM TARTRATE 5 MG PO TABS: 5.0000 mg | ORAL_TABLET | Freq: Every evening | ORAL | Status: DC | PRN

## 2014-01-11 MED ORDER — LANOLIN HYDROUS EX OINT: TOPICAL_OINTMENT | CUTANEOUS | Status: DC | PRN

## 2014-01-11 MED ORDER — WITCH HAZEL-GLYCERIN EX PADS: 1.0000 "application " | MEDICATED_PAD | CUTANEOUS | Status: DC | PRN

## 2014-01-11 MED ORDER — LABETALOL HCL 5 MG/ML IV SOLN
10.0000 mg | INTRAVENOUS | Status: DC | PRN
  Administered 2014-01-12: 10 mg via INTRAVENOUS
  Filled 2014-01-11: qty 4

## 2014-01-11 MED ORDER — PRENATAL MULTIVITAMIN CH: 1.0000 | ORAL_TABLET | Freq: Every day | ORAL | Status: DC

## 2014-01-11 MED ORDER — LACTATED RINGERS IV SOLN: 500.0000 mL | INTRAVENOUS | Status: DC | PRN

## 2014-01-11 MED ORDER — CITRIC ACID-SODIUM CITRATE 334-500 MG/5ML PO SOLN: 30.0000 mL | ORAL | Status: DC | PRN

## 2014-01-11 MED ORDER — MAGNESIUM SULFATE 40 G IN LACTATED RINGERS - SIMPLE
2.0000 g/h | INTRAVENOUS | Status: DC
  Filled 2014-01-11: qty 500

## 2014-01-11 MED ORDER — SIMETHICONE 80 MG PO CHEW
80.0000 mg | CHEWABLE_TABLET | ORAL | Status: DC | PRN
  Administered 2014-01-12 (×2): 80 mg via ORAL
  Filled 2014-01-11 (×2): qty 1

## 2014-01-11 MED ORDER — DIBUCAINE 1 % RE OINT: 1.0000 "application " | TOPICAL_OINTMENT | RECTAL | Status: DC | PRN

## 2014-01-11 MED ORDER — ONDANSETRON HCL 4 MG/2ML IJ SOLN: 4.0000 mg | Freq: Four times a day (QID) | INTRAMUSCULAR | Status: DC | PRN

## 2014-01-11 MED ORDER — OXYTOCIN BOLUS FROM INFUSION
500.0000 mL | INTRAVENOUS | Status: DC
  Administered 2014-01-11: 500 mL via INTRAVENOUS

## 2014-01-11 MED ORDER — FENTANYL 2.5 MCG/ML BUPIVACAINE 1/10 % EPIDURAL INFUSION (WH - ANES): 14.0000 mL/h | INTRAMUSCULAR | Status: DC | PRN

## 2014-01-11 MED ORDER — LACTATED RINGERS IV SOLN: 500.0000 mL | Freq: Once | INTRAVENOUS | Status: DC

## 2014-01-11 MED ORDER — OXYTOCIN 40 UNITS IN LACTATED RINGERS INFUSION - SIMPLE MED: 62.5000 mL/h | INTRAVENOUS | Status: DC

## 2014-01-11 MED ORDER — BUTORPHANOL TARTRATE 1 MG/ML IJ SOLN: 1.0000 mg | INTRAMUSCULAR | Status: DC | PRN

## 2014-01-11 MED ORDER — OXYTOCIN 40 UNITS IN LACTATED RINGERS INFUSION - SIMPLE MED
1.0000 m[IU]/min | INTRAVENOUS | Status: DC
  Filled 2014-01-11: qty 1000

## 2014-01-11 MED ORDER — MAGNESIUM SULFATE BOLUS VIA INFUSION
4.0000 g | Freq: Once | INTRAVENOUS | Status: AC
  Administered 2014-01-11: 4 g via INTRAVENOUS
  Filled 2014-01-11: qty 500

## 2014-01-11 NOTE — Progress Notes (Signed)
BPs noted, CNM and MD aware, notified of giving IV Labetalol per MD orders

## 2014-01-11 NOTE — Progress Notes (Signed)
  Subjective: Resting quietly.  Objective: BP 154/86 mmHg  Pulse 62  Temp(Src) 98 F (36.7 C) (Oral)  Resp 16  Ht 4\' 10"  (1.473 m)  Wt 152 lb 3.2 oz (69.037 kg)  BMI 31.82 kg/m2  LMP 05/03/2013      FHT: Category 1 UC:   regular, every 4 minutes, very mild SVE:  Deferred at present   Assessment:  Induction for pre-eclampsia, oligohydramnios, SGA vs IUGR. GBS by PCR pending  Plan: Continue current care. Re-evaluate at 3:30pm for next dose cytotech.  [name], Rachel Patel CNM 01/11/2014, 12:47 PM

## 2014-01-11 NOTE — Progress Notes (Signed)
  [facility]  Reports her sister-in-law and grandmother will be coming as support persons.  Sister-in-law will be adoptive mother, but paperwork will not be completed until after baby is d/c'd.  Patient will be taking baby home with her, and she is comfortable with that  Objective: BP 154/86 mmHg  Pulse 62  Temp(Src) 98 F (36.7 C) (Oral)  Resp 16  Ht 4\' 10"  (1.473 m)  Wt 152 lb 3.2 oz (69.037 kg)  BMI 31.82 kg/m2  LMP 05/03/2013      FHT: Category 1 UC:   Occasional, mild SVE:   Dilation: Fingertip Effacement (%): 50 Station: Ballotable Exam by:: Manfred ArchV Jahmel Flannagan CNM  Vtx verified by US today. Cytotech 25 mcg 1st dose placed in posterior fornix.  Assessment:  IUP at 36 1/7 weeks Induction for pre-eclampsia, oligo, IUGR vs SGA GBS pending from PCR this am. BUFA, in-family.  Plan: Cytotech q 4 hours up to 3 doses, pitocin when appropriate. Plan magnesium for 24 hours pp--will initiate earlier if BP increases or if other severe features arise.  [name], Ardyth Kelso CNM 01/11/2014, 11:47 AM

## 2014-01-11 NOTE — Progress Notes (Signed)
Pt requesting pain meds, V. LAtham CNM aware of FHR/Contractions, request for pain meds, and elevated BP.

## 2014-01-11 NOTE — Progress Notes (Signed)
GBS culture obtained.

## 2014-01-11 NOTE — Progress Notes (Signed)
Pt progressed quickly, SVD at 1657

## 2014-01-11 NOTE — Progress Notes (Signed)
Hospital day # 3 pregnancy at 4169w1d--Pre-eclampsia, Oligohydramnios, SGA vs IUGR, GBS pending  S:  Denies HA, visual sx, or epigastric pain      Perception of contractions:  Occasional cramping, mild      Vaginal bleeding: None       Vaginal discharge:  None  O: BP 157/92 mmHg  Pulse 58  Temp(Src) 98 F (36.7 C) (Oral)  Resp 20  Ht 4\' 10"  (1.473 m)  Wt 152 lb 3.2 oz (69.037 kg)  BMI 31.82 kg/m2  LMP 05/03/2013   BP range last 24 hours:  135-157/80-95        Fetal tracings:  Category 1 on TID NST yesterday      Contractions:   Very occasional, mild      Uterus non-tender      Extremities: no significant edema and no signs of DVT          Labs:   Results for orders placed or performed during the hospital encounter of 01/08/14 (from the past 24 hour(s))  CBC     Status: Abnormal   Collection Time: 01/11/14  5:40 AM  Result Value Ref Range   WBC 7.2 4.0 - 10.5 K/uL   RBC 3.36 (L) 3.87 - 5.11 MIL/uL   Hemoglobin 10.1 (L) 12.0 - 15.0 g/dL   HCT 16.129.4 (L) 09.636.0 - 04.546.0 %   MCV 87.5 78.0 - 100.0 fL   MCH 30.1 26.0 - 34.0 pg   MCHC 34.4 30.0 - 36.0 g/dL   RDW 40.913.0 81.111.5 - 91.415.5 %   Platelets 141 (L) 150 - 400 K/uL  Comprehensive metabolic panel     Status: Abnormal   Collection Time: 01/11/14  5:40 AM  Result Value Ref Range   Sodium 136 (L) 137 - 147 mEq/L   Potassium 3.8 3.7 - 5.3 mEq/L   Chloride 104 96 - 112 mEq/L   CO2 22 19 - 32 mEq/L   Glucose, Bld 71 70 - 99 mg/dL   BUN 7 6 - 23 mg/dL   Creatinine, Ser 7.820.52 0.50 - 1.10 mg/dL   Calcium 7.8 (L) 8.4 - 10.5 mg/dL   Total Protein 5.1 (L) 6.0 - 8.3 g/dL   Albumin 1.9 (L) 3.5 - 5.2 g/dL   AST 13 0 - 37 U/L   ALT 5 0 - 35 U/L   Alkaline Phosphatase 177 (H) 39 - 117 U/L   Total Bilirubin 0.3 0.3 - 1.2 mg/dL   GFR calc non Af Amer >90 >90 mL/min   GFR calc Af Amer >90 >90 mL/min   Anion gap 10 5 - 15  Type and screen     Status: None   Collection Time: 01/11/14  5:40 AM  Result Value Ref Range   ABO/RH(D) O POS    Antibody Screen NEG    Sample Expiration 01/14/2014    Platelets 01/10/14 165  24 hour urine protein = 2024 mg/24 hours.       Meds:  . docusate sodium  100 mg Oral Daily  . prenatal multivitamin  1 tablet Oral Q1200  . sodium chloride  3 mL Intravenous Q12H   US today:  AFI 4.02, < 3%ile, vtx  A: 7869w1d with pre-eclampsia, new oligohydramnios, mild thrombocytopenia, IUGR vs. SGA GBS pending from culture 12/16 (PCR invalid) BUFA by family   P:  Consult with Dr. Su Hiltoberts regarding plan of care--[facility].   [name], Rachel Audi CNM, [place] 01/11/2014 9:53 AM   Addendum: Per  consult with Dr. Su Hiltoberts: [facility] Repeat GBS PCR Cytotech, then pitocin Pain med/epidural prn.  Reviewed plan of care with patient--she is agreeable with induction. Will check cervix with PCR test.  Nigel BridgemanVicki Kayon Patel, CNM 01/11/14 10:20a

## 2014-01-11 NOTE — Progress Notes (Signed)
Pt to L&D via wheelchair for IOL.

## 2014-01-11 NOTE — Progress Notes (Signed)
Manfred ArchV. Latham CNM at bedside. Aware of pt elevated BP, advised to hold 40mg  of Labetalol until further orders. Pt progressing quickly, pain increasing, prepping for Epidural and potential delivery

## 2014-01-11 NOTE — Progress Notes (Signed)
Pt vomitting 

## 2014-01-12 ENCOUNTER — Inpatient Hospital Stay (HOSPITAL_COMMUNITY): Payer: Medicaid Other | Admitting: Anesthesiology

## 2014-01-12 ENCOUNTER — Encounter (HOSPITAL_COMMUNITY)
Admission: AD | Disposition: A | Discharge status: Home / Self Care | Payer: Self-pay | Source: Ambulatory Visit | Attending: Obstetrics and Gynecology

## 2014-01-12 HISTORY — PX: TUBAL LIGATION: SHX77

## 2014-01-12 LAB — CBC
HCT: 29.9 % — ABNORMAL LOW (ref 36.0–46.0)
Hemoglobin: 10.3 g/dL — ABNORMAL LOW (ref 12.0–15.0)
MCH: 30.4 pg (ref 26.0–34.0)
MCHC: 34.4 g/dL (ref 30.0–36.0)
MCV: 88.2 fL (ref 78.0–100.0)
PLATELETS: 148 10*3/uL — AB (ref 150–400)
RBC: 3.39 MIL/uL — AB (ref 3.87–5.11)
RDW: 13.2 % (ref 11.5–15.5)
WBC: 10.3 10*3/uL (ref 4.0–10.5)

## 2014-01-12 LAB — MAGNESIUM: MAGNESIUM: 4.5 mg/dL — AB (ref 1.5–2.5)

## 2014-01-12 SURGERY — LIGATION, FALLOPIAN TUBE, POSTPARTUM
Anesthesia: Epidural | Site: Abdomen | Laterality: Bilateral

## 2014-01-12 MED ORDER — FAMOTIDINE 20 MG PO TABS
40.0000 mg | ORAL_TABLET | Freq: Once | ORAL | Status: AC
  Administered 2014-01-12: 40 mg via ORAL
  Filled 2014-01-12: qty 2

## 2014-01-12 MED ORDER — SODIUM BICARBONATE 8.4 % IV SOLN
INTRAVENOUS | Status: DC | PRN
  Administered 2014-01-12 (×2): 3 mL via EPIDURAL
  Administered 2014-01-12: 4 mL via EPIDURAL
  Administered 2014-01-12: 3 mL via EPIDURAL
  Administered 2014-01-12: 2 mL via EPIDURAL
  Administered 2014-01-12: 1 mL via EPIDURAL

## 2014-01-12 MED ORDER — FENTANYL CITRATE 0.05 MG/ML IJ SOLN
INTRAMUSCULAR | Status: AC
  Administered 2014-01-12: 50 ug via INTRAVENOUS
  Filled 2014-01-12: qty 2

## 2014-01-12 MED ORDER — ONDANSETRON HCL 4 MG/2ML IJ SOLN
INTRAMUSCULAR | Status: AC
  Filled 2014-01-12: qty 2

## 2014-01-12 MED ORDER — BUPIVACAINE HCL (PF) 0.25 % IJ SOLN
INTRAMUSCULAR | Status: DC | PRN
  Administered 2014-01-12: 10 mL

## 2014-01-12 MED ORDER — CHLOROPROCAINE HCL 1 % IJ SOLN
INTRAMUSCULAR | Status: AC
  Filled 2014-01-12: qty 30

## 2014-01-12 MED ORDER — LACTATED RINGERS IV SOLN: INTRAVENOUS | Status: DC

## 2014-01-12 MED ORDER — FENTANYL CITRATE 0.05 MG/ML IJ SOLN
INTRAMUSCULAR | Status: AC
  Filled 2014-01-12: qty 2

## 2014-01-12 MED ORDER — MIDAZOLAM HCL 2 MG/2ML IJ SOLN
INTRAMUSCULAR | Status: AC
  Filled 2014-01-12: qty 2

## 2014-01-12 MED ORDER — CEFAZOLIN SODIUM-DEXTROSE 2-3 GM-% IV SOLR
INTRAVENOUS | Status: DC | PRN
  Administered 2014-01-12: 2 g via INTRAVENOUS

## 2014-01-12 MED ORDER — CHLOROPROCAINE HCL 3 % IJ SOLN
INTRAMUSCULAR | Status: AC
  Filled 2014-01-12: qty 20

## 2014-01-12 MED ORDER — SODIUM CHLORIDE 0.9 % IJ SOLN
3.0000 mL | Freq: Two times a day (BID) | INTRAMUSCULAR | Status: DC
  Administered 2014-01-12: 3 mL via INTRAVENOUS

## 2014-01-12 MED ORDER — MIDAZOLAM HCL 2 MG/2ML IJ SOLN
INTRAMUSCULAR | Status: DC | PRN
  Administered 2014-01-12 (×4): 1 mg via INTRAVENOUS

## 2014-01-12 MED ORDER — SODIUM CHLORIDE 0.9 % IJ SOLN
3.0000 mL | INTRAMUSCULAR | Status: DC | PRN
  Administered 2014-01-12: 3 mL via INTRAVENOUS
  Filled 2014-01-12: qty 3

## 2014-01-12 MED ORDER — FENTANYL CITRATE 0.05 MG/ML IJ SOLN
INTRAMUSCULAR | Status: DC | PRN
  Administered 2014-01-12 (×3): 50 ug via INTRAVENOUS

## 2014-01-12 MED ORDER — CEFAZOLIN SODIUM-DEXTROSE 2-3 GM-% IV SOLR
INTRAVENOUS | Status: AC
  Filled 2014-01-12: qty 50

## 2014-01-12 MED ORDER — SODIUM BICARBONATE 8.4 % IV SOLN
INTRAVENOUS | Status: DC | PRN
  Administered 2014-01-12: 20 mL via PERINEURAL

## 2014-01-12 MED ORDER — BUPIVACAINE HCL (PF) 0.25 % IJ SOLN
INTRAMUSCULAR | Status: AC
  Filled 2014-01-12: qty 30

## 2014-01-12 MED ORDER — METOCLOPRAMIDE HCL 10 MG PO TABS
10.0000 mg | ORAL_TABLET | Freq: Once | ORAL | Status: AC
  Administered 2014-01-12: 10 mg via ORAL
  Filled 2014-01-12: qty 1

## 2014-01-12 MED ORDER — FENTANYL CITRATE 0.05 MG/ML IJ SOLN
25.0000 ug | INTRAMUSCULAR | Status: DC | PRN
  Administered 2014-01-12: 50 ug via INTRAVENOUS

## 2014-01-12 MED ORDER — ONDANSETRON HCL 4 MG/2ML IJ SOLN
INTRAMUSCULAR | Status: DC | PRN
  Administered 2014-01-12: 4 mg via INTRAVENOUS

## 2014-01-12 MED ORDER — ACETAMINOPHEN 500 MG PO TABS
1000.0000 mg | ORAL_TABLET | Freq: Once | ORAL | Status: AC
  Administered 2014-01-13: 1000 mg via ORAL
  Filled 2014-01-12: qty 2

## 2014-01-12 SURGICAL SUPPLY — 35 items
APL SKNCLS STERI-STRIP NONHPOA (GAUZE/BANDAGES/DRESSINGS)
BENZOIN TINCTURE PRP APPL 2/3 (GAUZE/BANDAGES/DRESSINGS) IMPLANT
CHLORAPREP W/TINT 26ML (MISCELLANEOUS) ×3 IMPLANT
CLOSURE WOUND 1/4 X3 (GAUZE/BANDAGES/DRESSINGS)
CLOTH BEACON ORANGE TIMEOUT ST (SAFETY) ×3 IMPLANT
CONTAINER PREFILL 10% NBF 15ML (MISCELLANEOUS) ×6 IMPLANT
DRSG OPSITE POSTOP 3X4 (GAUZE/BANDAGES/DRESSINGS) ×3 IMPLANT
ELECT REM PT RETURN 9FT ADLT (ELECTROSURGICAL) ×3
ELECTRODE REM PT RTRN 9FT ADLT (ELECTROSURGICAL) ×1 IMPLANT
GLOVE BIO SURGEON STRL SZ7.5 (GLOVE) ×5 IMPLANT
GLOVE BIOGEL PI IND STRL 7.5 (GLOVE) ×1 IMPLANT
GLOVE BIOGEL PI INDICATOR 7.5 (GLOVE) ×4
GOWN STRL REUS W/TWL LRG LVL3 (GOWN DISPOSABLE) ×8 IMPLANT
HEMOSTAT SURGICEL 2X3 (HEMOSTASIS) ×2 IMPLANT
LIQUID BAND (GAUZE/BANDAGES/DRESSINGS) IMPLANT
NDL HYPO 25X1 1.5 SAFETY (NEEDLE) IMPLANT
NEEDLE HYPO 25X1 1.5 SAFETY (NEEDLE) IMPLANT
NS IRRIG 1000ML POUR BTL (IV SOLUTION) ×3 IMPLANT
PACK ABDOMINAL MINOR (CUSTOM PROCEDURE TRAY) ×3 IMPLANT
PENCIL BUTTON HOLSTER BLD 10FT (ELECTRODE) ×3 IMPLANT
SPONGE LAP 4X18 X RAY DECT (DISPOSABLE) ×2 IMPLANT
STRIP CLOSURE SKIN 1/4X3 (GAUZE/BANDAGES/DRESSINGS) IMPLANT
SUT MNCRL AB 3-0 PS2 27 (SUTURE) ×3 IMPLANT
SUT PLAIN 0 NONE (SUTURE) ×3 IMPLANT
SUT VIC AB 0 CT1 36 (SUTURE) ×3 IMPLANT
SUT VIC AB 2-0 SH 27 (SUTURE) ×3
SUT VIC AB 2-0 SH 27XBRD (SUTURE) IMPLANT
SUT VIC AB 3-0 SH 27 (SUTURE) ×3
SUT VIC AB 3-0 SH 27X BRD (SUTURE) IMPLANT
SYR CONTROL 10ML LL (SYRINGE) ×2 IMPLANT
TOWEL OR 17X24 6PK STRL BLUE (TOWEL DISPOSABLE) ×6 IMPLANT
TRAY FOLEY CATH 14FR (SET/KITS/TRAYS/PACK) ×3 IMPLANT
TUBING SUCTION BULK 100 FT (MISCELLANEOUS) ×2 IMPLANT
WATER STERILE IRR 1000ML POUR (IV SOLUTION) ×3 IMPLANT
YANKAUER SUCT BULB TIP NO VENT (SUCTIONS) ×2 IMPLANT

## 2014-01-12 NOTE — Anesthesia Procedure Notes (Signed)
Epidural Patient location during procedure: OR  Preanesthetic Checklist Completed: patient identified, site marked, surgical consent, pre-op evaluation, timeout performed, IV checked, risks and benefits discussed and monitors and equipment checked  Epidural Patient position: sitting Prep: site prepped and draped and DuraPrep Patient monitoring: continuous pulse ox and blood pressure Approach: midline Location: L3-L4 Injection technique: LOR air  Needle:  Needle type: Tuohy  Needle gauge: 17 G Needle length: 9 cm and 9 Needle insertion depth: 6 cm Catheter type: closed end flexible Catheter size: 19 Gauge Catheter at skin depth: 12 cm Test dose: negative  Assessment Sensory level: T8 Events: blood not aspirated, injection not painful, no injection resistance, negative IV test and no paresthesia  Additional Notes Spinal Dosage in OR  Bupivicaine ml       1.0 Catheter passe easily (-) asp heme/CSF

## 2014-01-12 NOTE — Anesthesia Postprocedure Evaluation (Signed)
  Anesthesia Post-op Note  Patient: Rachel Patel  Procedure(s) Performed: Procedure(s): POST PARTUM TUBAL LIGATION (Bilateral)  Patient is awake, responsive, moving her legs, and has signs of resolution of her numbness. Pain and nausea are reasonably well controlled. Vital signs are stable and clinically acceptable. Oxygen saturation is clinically acceptable. There are no apparent anesthetic complications at this time. Patient is ready for discharge.

## 2014-01-12 NOTE — Anesthesia Preprocedure Evaluation (Signed)
Anesthesia Evaluation  Patient identified by MRN, date of birth, ID band Patient awake    Reviewed: Allergy & Precautions, H&P , Patient's Chart, lab work & pertinent test results  Airway Mallampati: II  TM Distance: >3 FB Neck ROM: full    Dental no notable dental hx.    Pulmonary asthma , Current Smoker,  breath sounds clear to auscultation  Pulmonary exam normal       Cardiovascular Exercise Tolerance: Good hypertension (Mg+2), On Medications Rhythm:regular Rate:Normal     Neuro/Psych    GI/Hepatic   Endo/Other    Renal/GU      Musculoskeletal   Abdominal   Peds  Hematology   Anesthesia Other Findings   Reproductive/Obstetrics                             Anesthesia Physical Anesthesia Plan  ASA: II  Anesthesia Plan: Spinal, Epidural and Combined Spinal and Epidural   Post-op Pain Management:    Induction:   Airway Management Planned:   Additional Equipment:   Intra-op Plan:   Post-operative Plan:   Informed Consent: I have reviewed the patients History and Physical, chart, labs and discussed the procedure including the risks, benefits and alternatives for the proposed anesthesia with the patient or authorized representative who has indicated his/her understanding and acceptance.   Dental Advisory Given  Plan Discussed with: CRNA  Anesthesia Plan Comments: (Lab work confirmed with CRNA in room. Platelets okay. Discussed spinal anesthetic, and patient consents to the procedure:  included risk of possible headache,backache, failed block, allergic reaction, and nerve injury. This patient was asked if she had any questions or concerns before the procedure started. )        Anesthesia Quick Evaluation

## 2014-01-12 NOTE — Transfer of Care (Signed)
Immediate Anesthesia Transfer of Care Note  Patient: Rachel DartingKatrice Patel  Procedure(s) Performed: Procedure(s): POST PARTUM TUBAL LIGATION (Bilateral)  Patient Location: PACU  Anesthesia Type:Epidural  Level of Consciousness: awake and alert   Airway & Oxygen Therapy: Patient Spontanous Breathing and Patient connected to nasal cannula oxygen  Post-op Assessment: Report given to PACU RN and Post -op Vital signs reviewed and stable  Post vital signs: Reviewed and stable  Complications: No apparent anesthesia complications

## 2014-01-12 NOTE — Progress Notes (Signed)
Rachel Patel  Post Partum Day 1:S/P SVD and MgSO4 Infusion 6 HRs Post Op S/P BTL   Subjective: Patient up ad lib, denies syncope or dizziness. Patient does c/o mild abdominal pain and denies bowel movement or flatulence since surgery.  Managing pain with ibuprofen.  Reports consuming regular diet without issues and denies N/V. Denies issues with urination and reports bleeding is "light."  Infant skin to skin with adoptive mother.     Objective: Filed Vitals:   01/12/14 1504 01/12/14 1605 01/12/14 1708 01/12/14 1825  BP: 156/99 141/97 139/94 132/89  Pulse: 82 85 90 81  Temp: 97.9 F (36.6 C)     TempSrc: Oral     Resp: 16 20 16 18   Height:      Weight:      SpO2: 99% 100% 99% 98%    Recent Labs  01/11/14 1909 01/12/14 0510  HGB 11.2* 10.3*  HCT 33.1* 29.9*    Physical Exam:  General appearance: alert, cooperative and no distress Lungs: clear to auscultation bilaterally Heart: regular rate and rhythm Abdomen: normal findings: bowel sounds normal and soft, appropriately tender. Honeycomb dressing CDI. Pulses: 2+ and symmetric Skin: Skin color, texture, turgor normal. No rashes or lesions Lochia: Scant Laceration: None  Uterine Fundus: firm at +2/U DVT Evaluation: No evidence of DVT seen on physical exam. No significant calf/ankle edema.  Assessment S/P Vaginal Delivery-Day 1 6HRs Post Op Normal Involution Elevated BP Infant for Adoption  Plan[facility] Will continue to monitor b/p, prn labetalol ordered Plan for discharge tomorrow Continue current care Dr. Lance MorinA. Roberts to be updated on patient status   Erdine Hulen, Joyice FasterJESSICA LYNN, MSN, CNM 01/12/2014, 7:03 PM

## 2014-01-12 NOTE — Progress Notes (Signed)
Mother initially seen by CSW on 01/09/14.  Attempted follow up visit with mother, and informed that she was currently in the OR.    

## 2014-01-12 NOTE — Op Note (Signed)
Preop Diagnosis: Desires Sterilization   Postop Diagnosis: Desires Sterilization   Procedure: POST PARTUM BILATERAL PARTIAL SALPINGECTOMY  Anesthesia: Spinal   Anesthesiologist: Cristela BlueKyle Jackson, MD   Attending: Purcell NailsAngela Y Georgio Hattabaugh, MD   Assistant: Surgical Tech  Findings: Adhesions of left fallopian tube fimbriated end to the left ovary, nl appearing right fallopian tube, normal appearing bilateral ovaries  Pathology: Portions of bilateral fallopian tubes  Fluids: 900 cc  UOP: 800 cc  EBL: Minimal  Complications: None  Procedure:The patient was taken to the operating room after the risks, benefits, alternative, complications, treatment options, and expected outcomes were discussed with the patient. The patient verbalized understanding, the patient concurred with the proposed plan and consent signed and witnessed. The patient was taken to the Operating Room, identified as Rachel Patel and the procedure verified as bilateral tubal ligation. A Time Out was held and the above information confirmed.  The patient was given a surgical level via the epidural and prepped, draped, and catheterized in the normal, sterile fashion in the supine position.  A 10mm incision was made at the umbilicus and carried down to the underlying layer of fascia and peritoneum entered without difficulty.  The right fallopian tube was indentified and carried out to its fimbriated end and Kelly clamps used to clamp tube after elevating with babcock.  The tube was excised, ligated twice with 2-0 plain and suture ligated with 3-0 vicryl. The tube was sent to pathology and the remaining pedicle cauterized.  The same was done on the contralateral side, however, some adhesions at the fimbriated end were excised first.  The remaining pedicle was cauterized.  There was a small area beneath the sutures that was oozing from the area of removed adhesions and this area was cauterized and surgicel placed to ensure  hemostasis.      The fascia was then repaired with 0 vicryl via a running stitch and the incision was closed with 3-0 monocryl via subcuticular sutures and liquibond applied.  Instrument, sponge, and needle counts were correct, patient tolerated the procedure well and was returned to the recovery room in good condition.

## 2014-01-13 LAB — RPR

## 2014-01-13 LAB — COMPREHENSIVE METABOLIC PANEL
ALBUMIN: 2 g/dL — AB (ref 3.5–5.2)
ALK PHOS: 145 U/L — AB (ref 39–117)
ALT: 9 U/L (ref 0–35)
ANION GAP: 11 (ref 5–15)
AST: 18 U/L (ref 0–37)
BUN: 7 mg/dL (ref 6–23)
CO2: 24 mEq/L (ref 19–32)
CREATININE: 0.62 mg/dL (ref 0.50–1.10)
Calcium: 7.8 mg/dL — ABNORMAL LOW (ref 8.4–10.5)
Chloride: 101 mEq/L (ref 96–112)
GFR calc non Af Amer: >90 mL/min (ref >90)
GLUCOSE: 73 mg/dL (ref 70–99)
Potassium: 4 mEq/L (ref 3.7–5.3)
Sodium: 136 mEq/L — ABNORMAL LOW (ref 137–147)
TOTAL PROTEIN: 5.5 g/dL — AB (ref 6.0–8.3)
Total Bilirubin: 0.4 mg/dL (ref 0.3–1.2)

## 2014-01-13 LAB — CBC
HEMATOCRIT: 30.2 % — AB (ref 36.0–46.0)
Hemoglobin: 10.3 g/dL — ABNORMAL LOW (ref 12.0–15.0)
MCH: 30.1 pg (ref 26.0–34.0)
MCHC: 34.1 g/dL (ref 30.0–36.0)
MCV: 88.3 fL (ref 78.0–100.0)
Platelets: 144 10*3/uL — ABNORMAL LOW (ref 150–400)
RBC: 3.42 MIL/uL — ABNORMAL LOW (ref 3.87–5.11)
RDW: 13.5 % (ref 11.5–15.5)
WBC: 8.8 10*3/uL (ref 4.0–10.5)

## 2014-01-13 MED ORDER — LABETALOL HCL 100 MG PO TABS: 100.0000 mg | ORAL_TABLET | Freq: Two times a day (BID) | ORAL | Status: AC

## 2014-01-13 MED ORDER — IBUPROFEN 600 MG PO TABS: 600.0000 mg | ORAL_TABLET | Freq: Four times a day (QID) | ORAL | Status: DC

## 2014-01-13 MED ORDER — LABETALOL HCL 100 MG PO TABS
100.0000 mg | ORAL_TABLET | Freq: Two times a day (BID) | ORAL | Status: DC
  Administered 2014-01-13: 100 mg via ORAL
  Filled 2014-01-13: qty 1

## 2014-01-13 NOTE — Discharge Summary (Signed)
Vaginal Delivery Discharge Summary  Rachel DartingKatrice Patel  DOB:    [birth year] MRN:    [id number] CSN:    [id number]  Date of admission:                  01/08/14  Date of discharge:                   01/13/14  Procedures this admission:   Induction of labor, SVB, postpartum tubal ligation, magnesium sulfate for labor and 24 hours pp.  Date of Delivery: 01/11/14  Newborn Data:  Live born female  Birth Weight: 4 lb 5.5 oz (1970 g) APGAR: 8, 9  In-family adoption planned to sister-in-law, but legal papers not completed yet. Name: Rachel Patel Circumcision Plan: Outpatient.  History of Present Illness:  Ms. Rachel Patel is a 28 y.o. female, [id number], who presents at 5232w1d weeks gestation. [facility] She was admitted on 01/08/14 for monitoring due to IUGR and mild pre-eclampsia. Her pregnancy has been complicated by:  Patient Active Problem List   Diagnosis Date Noted  . Pre-eclampsia 01/11/2014  . Vaginal delivery 01/11/2014  . [redacted] weeks gestation of pregnancy   . Oligohydramnios   . IUGR (intrauterine growth restriction) 01/08/2014  . Mild preeclampsia 01/08/2014  . Pregnancy with adoption planned 01/08/2014     Hospital Course:  Admitted 01/08/14 for observation due to IUGR and mild pre-eclampsia. GBS done by PCR, but inadequate--culture sent, but PCR repeated [facility] 12[facility]18 [facility] due to new onset oligohydramnios and severe features. Progressed after single dose of Cytotech--wanted epidural, but labor progressed very rapidly. Utilized Fentanyl x 1 for pain management.  Magnesium sulfate was begun just prior to delivery, when BP was elevated and non-responsive to IV Labetalol.  Apresoline was planned, but patient rapidly delivered prior to the dose.  Delivery was performed by Rachel BridgemanVicki Gustavo Patel, CNM, without complication. Patient  and baby tolerated the procedure without difficulty, with no laceration noted. Infant status was stable and remained in room with mother.  GBS was negative by repeat PCR on 01/11/14.  Magnesium sulfate was continued for 24 hours pp.  Patient had BTL on day 1, with adhesions noted.  Patient did have increased pain on the day of the procedure, but this resolved.  Patient was placed on Labetalol 100 mg po PO BID on the day of d/c.  Mother and infant had an overall uncomplicated postpartum course, with breast feeding going well. Mom's physical exam was WNL on day 2, and she was discharged home in stable condition. She received adequate benefit from po pain medications, using Motrin only. [facility] RN was requested to see patient early next week for BP check. Patient's sister-in-law plans to adopt the baby, with patient involved in the baby's life.    Feeding:  breast  Contraception:  bilateral tubal ligation  Discharge hemoglobin:  HEMOGLOBIN  Date Value Ref Range Status  01/13/2014 10.3* 12.0 - 15.0 g/dL Final  13/08/657808/27/2015 46.911.1 g/dL Final   HCT  Date Value Ref Range Status  01/13/2014 30.2* 36.0 - 46.0 % Final  09/20/2013 33 % Final    Discharge Physical Exam:   General: alert Lochia: appropriate Uterine Fundus: firm Incision: None DVT Evaluation: No evidence of DVT seen on physical exam. Negative Homan's sign.  Intrapartum Procedures: spontaneous vaginal delivery Postpartum Procedures: none Complications-Operative and Postpartum: none  Discharge Diagnoses:  Term Pregnancy-delivered, pre-eclampsia, desired BTL  Discharge Information:  Activity:           pelvic rest Diet:                routine Medications: Ibuprofen and Labetalol 100 mg po BID Condition:      stable Instructions:     Discharge to: home  Follow-up Information    [facility]. Schedule an appointment as soon as possible for a visit in 6 weeks.   Specialty:   Obstetrics and Gynecology   Why:  Call for any questions or concerns.  [facility] RN will see you this week for BP check.   Contact information:   [address] [place] [id number] [phone]       Rachel BridgemanLATHAM, Rachel Patel [facility] Center[facility]M 01/13/2014 9:16 AM

## 2014-01-13 NOTE — Addendum Note (Signed)
Addendum  created 01/13/14 0743 by Armanda Heritageharlesetta M Joye Wesenberg, CRNA   Modules edited: Notes Section   Notes Section:  File: [id number]

## 2014-01-13 NOTE — Discharge Instructions (Signed)
Postpartum Care After Vaginal Delivery °After you deliver your newborn (postpartum period), the usual stay in the hospital is 24-72 hours. If there were problems with your labor or delivery, or if you have other medical problems, you might be in the hospital longer.  °While you are in the hospital, you will receive help and instructions on how to care for yourself and your newborn during the postpartum period.  °While you are in the hospital: °· Be sure to tell your nurses if you have pain or discomfort, as well as where you feel the pain and what makes the pain worse. °· If you had an incision made near your vagina (episiotomy) or if you had some tearing during delivery, the nurses may put ice packs on your episiotomy or tear. The ice packs may help to reduce the pain and swelling. °· If you are breastfeeding, you may feel uncomfortable contractions of your uterus for a couple of weeks. This is normal. The contractions help your uterus get back to normal size. °· It is normal to have some bleeding after delivery. °· For the first 1-3 days after delivery, the flow is red and the amount may be similar to a period. °· It is common for the flow to start and stop. °· In the first few days, you may pass some small clots. Let your nurses know if you begin to pass large clots or your flow increases. °· Do not  flush blood clots down the toilet before having the nurse look at them. °· During the next 3-10 days after delivery, your flow should become more watery and pink or brown-tinged in color. °· Ten to fourteen days after delivery, your flow should be a small amount of yellowish-white discharge. °· The amount of your flow will decrease over the first few weeks after delivery. Your flow may stop in 6-8 weeks. Most women have had their flow stop by 12 weeks after delivery. °· You should change your sanitary pads frequently. °· Wash your hands thoroughly with soap and water for at least 20 seconds after changing pads, using  the toilet, or before holding or feeding your newborn. °· You should feel like you need to empty your bladder within the first 6-8 hours after delivery. °· In case you become weak, lightheaded, or faint, call your nurse before you get out of bed for the first time and before you take a shower for the first time. °· Within the first few days after delivery, your breasts may begin to feel tender and full. This is called engorgement. Breast tenderness usually goes away within 48-72 hours after engorgement occurs. You may also notice milk leaking from your breasts. If you are not breastfeeding, do not stimulate your breasts. Breast stimulation can make your breasts produce more milk. °· Spending as much time as possible with your newborn is very important. During this time, you and your newborn can feel close and get to know each other. Having your newborn stay in your room (rooming in) will help to strengthen the bond with your newborn.  It will give you time to get to know your newborn and become comfortable caring for your newborn. °· Your hormones change after delivery. Sometimes the hormone changes can temporarily cause you to feel sad or tearful. These feelings should not last more than a few days. If these feelings last longer than that, you should talk to your caregiver. °· If desired, talk to your caregiver about methods of family planning or contraception. °·   Talk to your caregiver about immunizations. Your caregiver may want you to have the following immunizations before leaving the hospital:  Tetanus, diphtheria, and pertussis (Tdap) or tetanus and diphtheria (Td) immunization. It is very important that you and your family (including grandparents) or others caring for your newborn are up-to-date with the Tdap or Td immunizations. The Tdap or Td immunization can help protect your newborn from getting ill.  Rubella immunization.  Varicella (chickenpox) immunization.  Influenza immunization. You should  receive this annual immunization if you did not receive the immunization during your pregnancy. Document Released: 11/08/2006 Document Revised: 10/06/2011 Document Reviewed: 09/08/2011 [facility] Patient Information 2015 [place], [place]. This information is not intended to replace advice given to you by your health care provider. Make sure you discuss any questions you have with your health care provider.  Postpartum Tubal Ligation Care After Refer to this sheet in the next few weeks. These instructions provide you with information on caring for yourself after your procedure. Your caregiver may also give you more specific instructions. Your treatment has been planned according to current medical practices, but problems sometimes occur. Call your caregiver if you have any problems or questions after your procedure. HOME CARE INSTRUCTIONS   Rest the remainder of the day.  Only take over-the-counter or prescription medicines for pain, discomfort, or fever as directed by your caregiver. Do not take aspirin. It can cause bleeding.  Gradually resume daily activities, diet, rest, driving, and work.  Avoid sexual intercourse for 2 weeks or as directed.  Do not drive while taking pain medicine.  Do not lift anything over 5 pounds for 2 weeks or as directed.  Only take showers, not baths, until you are seen by your caregiver.  Change bandages (dressings) as directed.  Take your temperature twice a day and record it.  Try to have help for the first 7-10 days for your household needs.  Return to your caregiver to get your stitches (sutures) removed and for follow-up visits as directed. SEEK MEDICAL CARE IF:   You have redness, swelling, or increasing pain in the wound.  You have drainage from the wound lasting longer than 1 day.  Your pain is getting worse.  You have a rash.  You become dizzy or lightheaded.  You have a reaction to your medicine.  You need stronger medicine or a change  in your pain medicine.  You notice a bad smell coming from the wound or dressing.  Your wound breaks open after the sutures have been removed.  You are constipated. SEEK IMMEDIATE MEDICAL CARE IF:   You faint.  You have a fever.  You have increasing abdominal pain.  You have severe pain in your shoulders.  You have bleeding or drainage from the suture sites or vagina following surgery.  You have shortness of breath or difficulty breathing.  You have chest or leg pain.  You have persistent nausea, vomiting, or diarrhea. MAKE SURE YOU:   Understand these instructions.  Watch your condition.  Get help right away if you are not doing well or get worse. Document Released: 07/13/2011 Document Reviewed: 07/13/2011 [facility] Patient Information 2015 [place], [place]. This information is not intended to replace advice given to you by your health care provider. Make sure you discuss any questions you have with your health care provider.  Hypertension During Pregnancy Hypertension, or high blood pressure, is when there is extra pressure inside your blood vessels that carry blood from the heart to the rest of your body (  arteries). It can happen at any time in life, including pregnancy. Hypertension during pregnancy can cause problems for you and your baby. Your baby might not weigh as much as he or she should at birth or might be born early (premature). Very bad cases of hypertension during pregnancy can be life-threatening.  Different types of hypertension can occur during pregnancy. These include:  Chronic hypertension. This happens when a woman has hypertension before pregnancy and it continues during pregnancy.  Gestational hypertension. This is when hypertension develops during pregnancy.  Preeclampsia or toxemia of pregnancy. This is a very serious type of hypertension that develops only during pregnancy. It affects the whole body and can be very dangerous for both mother and baby.   Gestational hypertension and preeclampsia usually go away after your baby is born. Your blood pressure will likely stabilize within 6 weeks. Women who have hypertension during pregnancy have a greater chance of developing hypertension later in life or with future pregnancies. RISK FACTORS There are certain factors that make it more likely for you to develop hypertension during pregnancy. These include:  Having hypertension before pregnancy.  Having hypertension during a previous pregnancy.  Being overweight.  Being older than 40 years.  Being pregnant with more than one baby.  Having diabetes or kidney problems. SIGNS AND SYMPTOMS Chronic and gestational hypertension rarely cause symptoms. Preeclampsia has symptoms, which may include:  Increased protein in your urine. Your health care provider will check for this at every prenatal visit.  Swelling of your hands and face.  Rapid weight gain.  Headaches.  Visual changes.  Being bothered by light.  Abdominal pain, especially in the upper right area.  Chest pain.  Shortness of breath.  Increased reflexes.  Seizures. These occur with a more severe form of preeclampsia, called eclampsia. DIAGNOSIS  You may be diagnosed with hypertension during a regular prenatal exam. At each prenatal visit, you may have:  Your blood pressure checked.  A urine test to check for protein in your urine. The type of hypertension you are diagnosed with depends on when you developed it. It also depends on your specific blood pressure reading.  Developing hypertension before 20 weeks of pregnancy is consistent with chronic hypertension.  Developing hypertension after 20 weeks of pregnancy is consistent with gestational hypertension.  Hypertension with increased urinary protein is diagnosed as preeclampsia.  Blood pressure measurements that stay above 160 systolic or 110 diastolic are a sign of severe preeclampsia. TREATMENT Treatment for  hypertension during pregnancy varies. Treatment depends on the type of hypertension and how serious it is.  If you take medicine for chronic hypertension, you may need to switch medicines.  Medicines called ACE inhibitors should not be taken during pregnancy.  Low-dose aspirin may be suggested for women who have risk factors for preeclampsia.  If you have gestational hypertension, you may need to take a blood pressure medicine that is safe during pregnancy. Your health care provider will recommend the correct medicine.  If you have severe preeclampsia, you may need to be in the hospital. Health care providers will watch you and your baby very closely. You also may need to take medicine called magnesium sulfate to prevent seizures and lower blood pressure.  Sometimes, an early delivery is needed. This may be the case if the condition worsens. It would be done to protect you and your baby. The only cure for preeclampsia is delivery.  Your health care provider may recommend that you take one low-dose aspirin (81 mg)  each day to help prevent high blood pressure during your pregnancy if you are at risk for preeclampsia. You may be at risk for preeclampsia if:  You had preeclampsia or eclampsia during a previous pregnancy.  Your baby did not grow as expected during a previous pregnancy.  You experienced preterm birth with a previous pregnancy.  You experienced a separation of the placenta from the uterus (placental abruption) during a previous pregnancy.  You experienced the loss of your baby during a previous pregnancy.  You are pregnant with more than one baby.  You have other medical conditions, such as diabetes or an autoimmune disease. HOME CARE INSTRUCTIONS  Schedule and keep all of your regular prenatal care appointments. This is important.  Take medicines only as directed by your health care provider. Tell your health care provider about all medicines you take.  Eat as little  salt as possible.  Get regular exercise.  Do not drink alcohol.  Do not use tobacco products.  Do not drink products with caffeine.  Lie on your left side when resting. SEEK IMMEDIATE MEDICAL CARE IF:  You have severe abdominal pain.  You have sudden swelling in your hands, ankles, or face.  You gain 4 pounds (1.8 kg) or more in 1 week.  You vomit repeatedly.  You have vaginal bleeding.  You have a severe headache.  You have blurred or double vision.  You have muscle twitching or spasms.  You have shortness of breath.  You have blue fingernails or lips.  You have blood in your urine. MAKE SURE YOU:  Understand these instructions.  Will watch your condition.  Will get help right away if you are not doing well or get worse. Document Released: 09/29/2010 Document Revised: 05/28/2013 Document Reviewed: 08/10/2012 [facility] Patient Information 2015 [place], [place]. This information is not intended to replace advice given to you by your health care provider. Make sure you discuss any questions you have with your health care provider.

## 2014-01-13 NOTE — Anesthesia Postprocedure Evaluation (Signed)
  Anesthesia Post-op Note  Patient: Rachel Patel  Procedure(s) Performed: Procedure(s): POST PARTUM TUBAL LIGATION (Bilateral)  Patient Location: PACU and Mother/Baby  Anesthesia Type:Epidural  Level of Consciousness: awake, alert  and oriented  Airway and Oxygen Therapy: Patient Spontanous Breathing  Post-op Pain: mild  Post-op Assessment: Post-op Vital signs reviewed  Post-op Vital Signs: Reviewed and stable  Last Vitals:  Filed Vitals:   01/13/14 0638  BP: 148/82  Pulse: 62  Temp: 37.1 C  Resp: 20    Complications: No apparent anesthesia complications

## 2014-01-13 NOTE — Progress Notes (Signed)
Spoke with RN and informed that mother is being discharged today, and newborn will be discharged to her.  The family will continue with adoption plans independent of the hospital.  No concerns were noted.

## 2014-01-15 ENCOUNTER — Encounter (HOSPITAL_COMMUNITY): Payer: Self-pay | Admitting: Obstetrics and Gynecology

## 2014-02-22 ENCOUNTER — Emergency Department (HOSPITAL_COMMUNITY): Payer: Medicaid Other

## 2014-02-22 ENCOUNTER — Emergency Department (HOSPITAL_COMMUNITY)
Admission: EM | Admit: 2014-02-22 | Discharge: 2014-02-22 | Disposition: A | Discharge status: Home / Self Care | Payer: Medicaid Other | Attending: Emergency Medicine | Admitting: Emergency Medicine

## 2014-02-22 ENCOUNTER — Encounter (HOSPITAL_COMMUNITY): Payer: Self-pay | Admitting: Emergency Medicine

## 2014-02-22 DIAGNOSIS — I10 Essential (primary) hypertension: Secondary | ICD-10-CM | POA: Insufficient documentation

## 2014-02-22 DIAGNOSIS — S99922A Unspecified injury of left foot, initial encounter: Secondary | ICD-10-CM | POA: Diagnosis present

## 2014-02-22 DIAGNOSIS — Z79899 Other long term (current) drug therapy: Secondary | ICD-10-CM | POA: Diagnosis not present

## 2014-02-22 DIAGNOSIS — Y939 Activity, unspecified: Secondary | ICD-10-CM | POA: Insufficient documentation

## 2014-02-22 DIAGNOSIS — X58XXXA Exposure to other specified factors, initial encounter: Secondary | ICD-10-CM | POA: Diagnosis not present

## 2014-02-22 DIAGNOSIS — Y998 Other external cause status: Secondary | ICD-10-CM | POA: Insufficient documentation

## 2014-02-22 DIAGNOSIS — J45909 Unspecified asthma, uncomplicated: Secondary | ICD-10-CM | POA: Insufficient documentation

## 2014-02-22 DIAGNOSIS — Z720 Tobacco use: Secondary | ICD-10-CM | POA: Diagnosis not present

## 2014-02-22 DIAGNOSIS — Y929 Unspecified place or not applicable: Secondary | ICD-10-CM | POA: Diagnosis not present

## 2014-02-22 DIAGNOSIS — S96912A Strain of unspecified muscle and tendon at ankle and foot level, left foot, initial encounter: Secondary | ICD-10-CM | POA: Insufficient documentation

## 2014-02-22 IMAGING — DX DG FOOT COMPLETE 3+V*L*
3 series · 3 of 3 positions shown · non-contrast
Comparison: None.

CLINICAL DATA: Patient woke up with left foot hurting along the
lateral side

EXAM:
LEFT FOOT - COMPLETE 3+ VIEW

[foot ap]
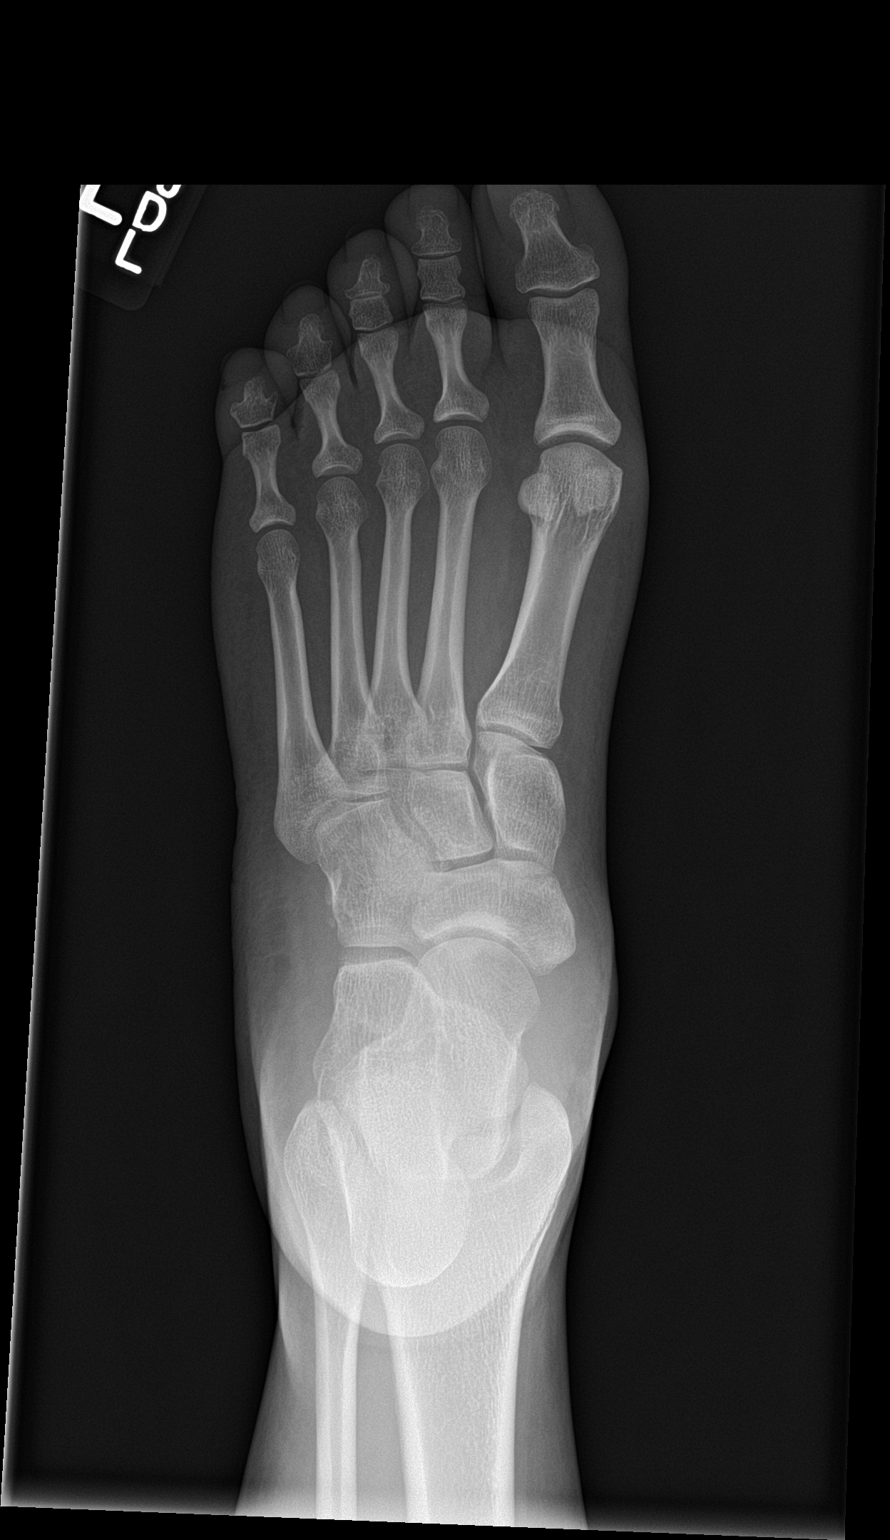

[foot obl]
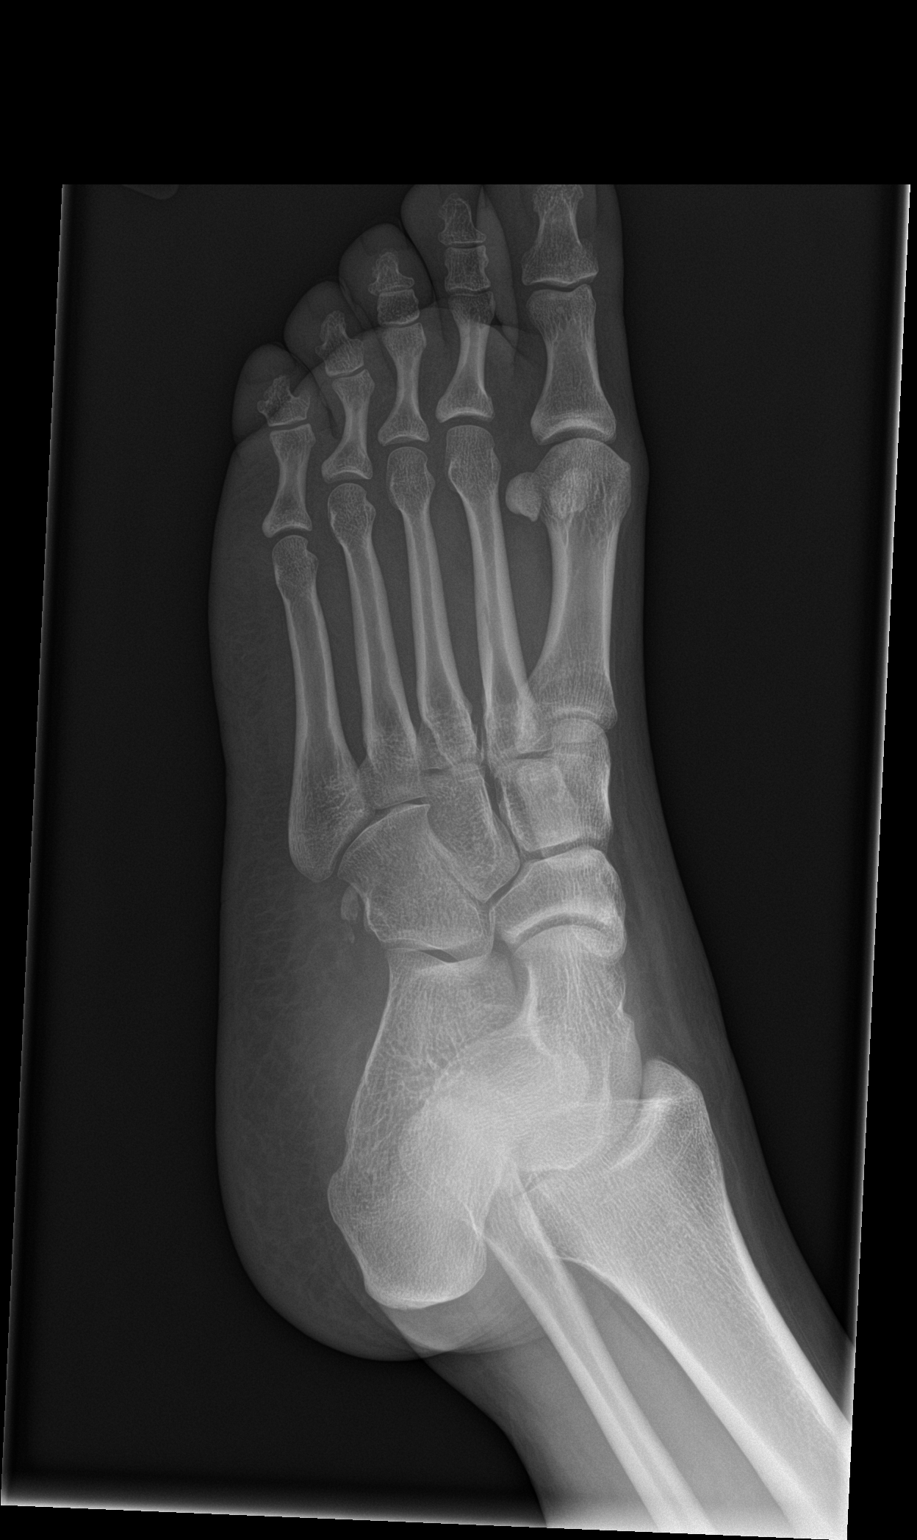

[foot lat]
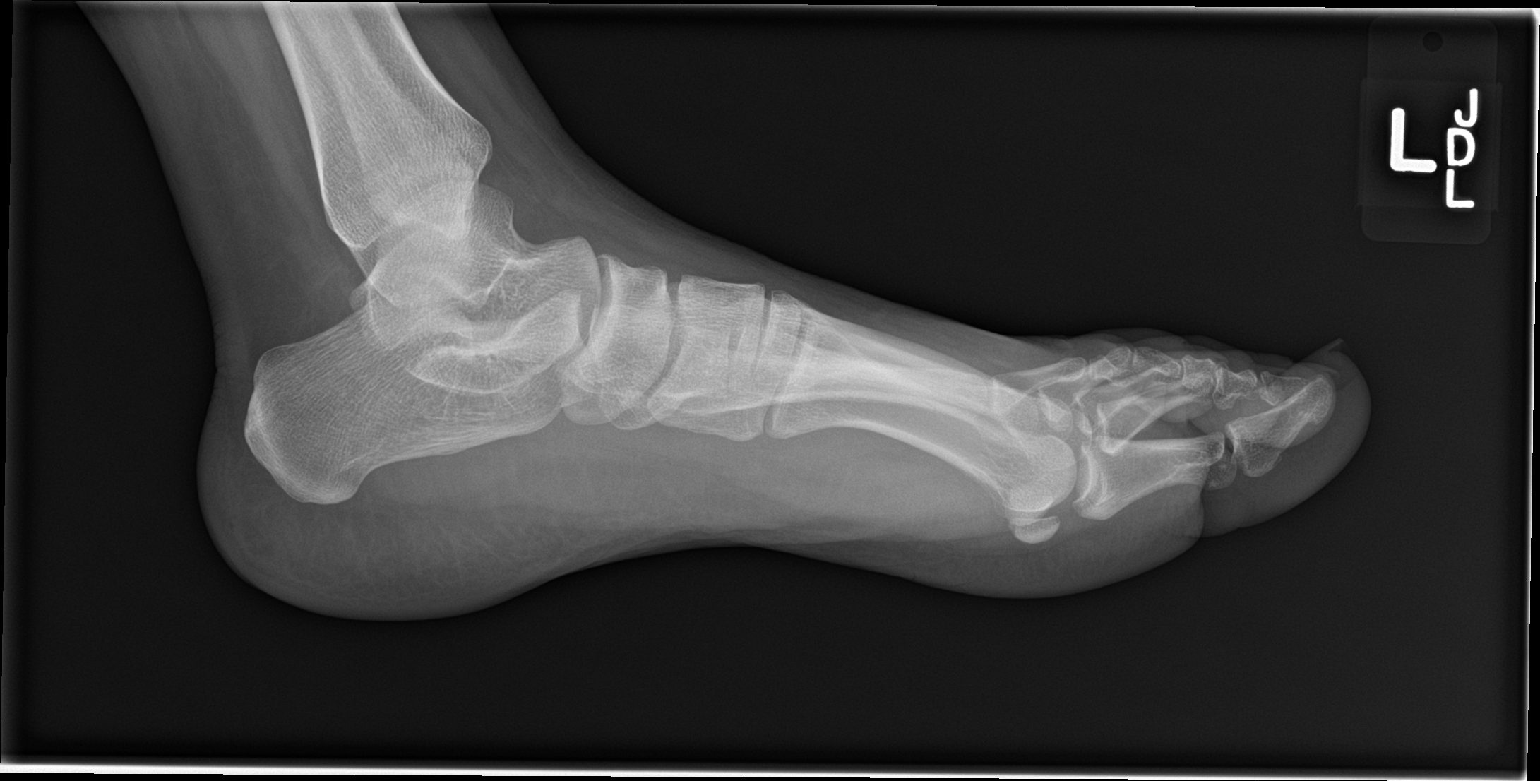

[3 of 3 positions shown; findings below may reference images not displayed]

FINDINGS: There is no evidence of fracture or dislocation. There is no
evidence of arthropathy or other focal bone abnormality. Soft
tissues are unremarkable.
IMPRESSION: No acute osseous injury of the left foot.

## 2014-02-22 MED ORDER — HYDROCODONE-ACETAMINOPHEN 5-325 MG PO TABS
1.0000 | ORAL_TABLET | Freq: Once | ORAL | Status: AC
Start: 2014-02-22 — End: 2014-02-22
  Administered 2014-02-22: 1 via ORAL
  Filled 2014-02-22: qty 1

## 2014-02-22 MED ORDER — IBUPROFEN 800 MG PO TABS: 800.0000 mg | ORAL_TABLET | Freq: Three times a day (TID) | ORAL | Status: DC

## 2014-02-22 NOTE — ED Notes (Signed)
Pt taken to xray 

## 2014-02-22 NOTE — ED Provider Notes (Signed)
CSN: [id number]     Arrival date & time 02/22/14  2141 History   First MD Initiated Contact with Patient 02/22/14 2203     Chief Complaint  Patient presents with  . Foot Pain  . Ankle Pain     (Consider location/radiation/quality/duration/timing/severity/associated sxs/prior Treatment) Patient is a 29 y.o. female presenting with lower extremity pain and ankle pain. The history is provided by the patient. No language interpreter was used.  Foot Pain This is a new problem. The current episode started today. Pertinent negatives include no chills, fever or numbness. Associated symptoms comments: Pain in the lateral/proximal left foot since waking this morning. No known injury. She reports similar pain last week that resolved spontaneously. She was up on her feet yesterday for most of yesterday and feels that may have caused the pain to recur. No calf or thigh pain/swelling..  Ankle Pain Associated symptoms: no fever     Past Medical History  Diagnosis Date  . Asthma     during pregnancy  . Hypertension     during pregnancy  . Pregnancy induced hypertension    Past Surgical History  Procedure Laterality Date  . No past surgeries    . Tubal ligation Bilateral 01/12/2014    Procedure: POST PARTUM TUBAL LIGATION;  Surgeon: Delice Lesch, MD;  Location: [facility];  Service: Gynecology;  Laterality: Bilateral;   Family History  Problem Relation Age of Onset  . Other Neg Hx   . Hypertension Mother     PREECLAMPSIA   History  Substance Use Topics  . Smoking status: Current Every Day Smoker -- 0.50 packs/day    Types: Cigarettes  . Smokeless tobacco: Never Used  . Alcohol Use: No   OB History    Gravida Para Term Preterm AB TAB SAB Ectopic Multiple Living   _0 0 4     Review of Systems  Constitutional: Negative for fever and chills.  Musculoskeletal:       See HPI.  Skin: Negative.   Neurological: Negative.  Negative for numbness.      Allergies   Codeine  Home Medications   Prior to Admission medications   Medication Sig Start Date End Date Taking? Authorizing Provider  ibuprofen (ADVIL,MOTRIN) 600 MG tablet Take 1 tablet (600 mg total) by mouth every 6 (six) hours. 01/13/14   Donnel Saxon, CNM  labetalol (NORMODYNE) 100 MG tablet Take 1 tablet (100 mg total) by mouth 2 (two) times daily. 01/13/14   Donnel Saxon, CNM  neomycin-polymyxin-hydrocortisone (CORTISPORIN) 3.5-10000-1 otic suspension Place 4 drops into the right ear 3 (three) times daily. Patient not taking: Reported on 01/08/2014 12/08/13   Gregor Hams, MD  PredniSONE 5 MG KIT 6 day kit po Patient not taking: Reported on 01/08/2014 12/11/12   Gregor Hams, MD  Prenatal w/o A Vit-Fe Fum-FA (PRENATA PO) Take 1 tablet by mouth daily.     Historical Provider, MD   BP 135/92 mmHg  Pulse 79  Temp(Src) 98.7 F (37.1 C) (Oral)  Resp 22  Ht 4' 9" (1.448 m)  Wt 133 lb (60.328 kg)  BMI 28.77 kg/m2  SpO2 99% Physical Exam  Constitutional: She is oriented to person, place, and time. She appears well-developed and well-nourished.  Neck: Normal range of motion.  Pulmonary/Chest: Effort normal.  Musculoskeletal:  Left foot mildly swollen overlying 5th MT base. Tender to this area. Ankle joint non-tender and without laxity. No calf or thigh tenderness to palpation.  FROM ankle and all digits.   Neurological: She is alert and oriented to person, place, and time.  Skin: Skin is warm and dry.    ED Course  Procedures (including critical care time) Labs Review Labs Reviewed - No data to display  Imaging Review No results found.   EKG Interpretation None     Dg Foot Complete Left  02/22/2014   CLINICAL DATA:  Patient woke up with left foot hurting along the lateral side  EXAM: LEFT FOOT - COMPLETE 3+ VIEW  COMPARISON:  None.  FINDINGS: There is no evidence of fracture or dislocation. There is no evidence of arthropathy or other focal bone abnormality. Soft tissues are  unremarkable.  IMPRESSION: No acute osseous injury of the left foot.   Electronically Signed   By: Kathreen Devoid   On: 02/22/2014 23:45    MDM   Final diagnoses:  None    1. Left foot pain  Patient provided supportive care for non-fracture injury. Referred to PCP for recheck if pain persists.   Dewaine Oats, PA-C 03/01/14 0410  Wandra Arthurs, MD 03/03/14 [phone]

## 2014-02-22 NOTE — ED Notes (Signed)
Pt states she woke up with pain and swelling to L ankle and foot this morning.  Denies known injury.  States same symptoms about a week ago that went away.   Soaked foot without relief.

## 2014-02-22 NOTE — Discharge Instructions (Signed)
Cryotherapy °Cryotherapy means treatment with cold. Ice or gel packs can be used to reduce both pain and swelling. Ice is the most helpful within the first 24 to 48 hours after an injury or flare-up from overusing a muscle or joint. Sprains, strains, spasms, burning pain, shooting pain, and aches can all be eased with ice. Ice can also be used when recovering from surgery. Ice is effective, has very few side effects, and is safe for most people to use. °PRECAUTIONS  °Ice is not a safe treatment option for people with: °· Raynaud phenomenon. This is a condition affecting small blood vessels in the extremities. Exposure to cold may cause your problems to return. °· Cold hypersensitivity. There are many forms of cold hypersensitivity, including: °¨ Cold urticaria. Red, itchy hives appear on the skin when the tissues begin to warm after being iced. °¨ Cold erythema. This is a red, itchy rash caused by exposure to cold. °¨ Cold hemoglobinuria. Red blood cells break down when the tissues begin to warm after being iced. The hemoglobin that carry oxygen are passed into the urine because they cannot combine with blood proteins fast enough. °· Numbness or altered sensitivity in the area being iced. °If you have any of the following conditions, do not use ice until you have discussed cryotherapy with your caregiver: °· Heart conditions, such as arrhythmia, angina, or chronic heart disease. °· High blood pressure. °· Healing wounds or open skin in the area being iced. °· Current infections. °· Rheumatoid arthritis. °· Poor circulation. °· Diabetes. °Ice slows the blood flow in the region it is applied. This is beneficial when trying to stop inflamed tissues from spreading irritating chemicals to surrounding tissues. However, if you expose your skin to cold temperatures for too long or without the proper protection, you can damage your skin or nerves. Watch for signs of skin damage due to cold. °HOME CARE INSTRUCTIONS °Follow  these tips to use ice and cold packs safely. °· Place a dry or damp towel between the ice and skin. A damp towel will cool the skin more quickly, so you may need to shorten the time that the ice is used. °· For a more rapid response, add gentle compression to the ice. °· Ice for no more than 10 to 20 minutes at a time. The bonier the area you are icing, the less time it will take to get the benefits of ice. °· Check your skin after 5 minutes to make sure there are no signs of a poor response to cold or skin damage. °· Rest 20 minutes or more between uses. °· Once your skin is numb, you can end your treatment. You can test numbness by very lightly touching your skin. The touch should be so light that you do not see the skin dimple from the pressure of your fingertip. When using ice, most people will feel these normal sensations in this order: cold, burning, aching, and numbness. °· Do not use ice on someone who cannot communicate their responses to pain, such as small children or people with dementia. °HOW TO MAKE AN ICE PACK °Ice packs are the most common way to use ice therapy. Other methods include ice massage, ice baths, and cryosprays. Muscle creams that cause a cold, tingly feeling do not offer the same benefits that ice offers and should not be used as a substitute unless recommended by your caregiver. °To make an ice pack, do one of the following: °· Place crushed ice or a   bag of frozen vegetables in a sealable plastic bag. Squeeze out the excess air. Place this bag inside another plastic bag. Slide the bag into a pillowcase or place a damp towel between your skin and the bag. °· Mix 3 parts water with 1 part rubbing alcohol. Freeze the mixture in a sealable plastic bag. When you remove the mixture from the freezer, it will be slushy. Squeeze out the excess air. Place this bag inside another plastic bag. Slide the bag into a pillowcase or place a damp towel between your skin and the bag. °SEEK MEDICAL CARE  IF: °· You develop white spots on your skin. This may give the skin a blotchy (mottled) appearance. °· Your skin turns blue or pale. °· Your skin becomes waxy or hard. °· Your swelling gets worse. °MAKE SURE YOU:  °· Understand these instructions. °· Will watch your condition. °· Will get help right away if you are not doing well or get worse. °Document Released: 09/07/2010 Document Revised: 05/28/2013 Document Reviewed: 09/07/2010 °[employer]® Patient Information ©2015 [employer]. This information is not intended to replace advice given to you by your health care provider. Make sure you discuss any questions you have with your health care provider. °Foot Sprain °The muscles and cord like structures which attach muscle to bone (tendons) that surround the feet are made up of units. A foot sprain can occur at the weakest spot in any of these units. This condition is most often caused by injury to or overuse of the foot, as from playing contact sports, or aggravating a previous injury, or from poor conditioning, or obesity. °SYMPTOMS °· Pain with movement of the foot. °· Tenderness and swelling at the injury site. °· Loss of strength is present in moderate or severe sprains. °THE THREE GRADES OR SEVERITY OF FOOT SPRAIN ARE: °· Mild (Grade I): Slightly pulled muscle without tearing of muscle or tendon fibers or loss of strength. °· Moderate (Grade II): Tearing of fibers in a muscle, tendon, or at the attachment to bone, with small decrease in strength. °· Severe (Grade III): Rupture of the muscle-tendon-bone attachment, with separation of fibers. Severe sprain requires surgical repair. Often repeating (chronic) sprains are caused by overuse. Sudden (acute) sprains are caused by direct injury or over-use. °DIAGNOSIS  °Diagnosis of this condition is usually by your own observation. If problems continue, a caregiver may be required for further evaluation and treatment. X-rays may be required to make sure there are not  breaks in the bones (fractures) present. Continued problems may require physical therapy for treatment. °PREVENTION °· Use strength and conditioning exercises appropriate for your sport. °· Warm up properly prior to working out. °· Use athletic shoes that are made for the sport you are participating in. °· Allow adequate time for healing. Early return to activities makes repeat injury more likely, and can lead to an unstable arthritic foot that can result in prolonged disability. Mild sprains generally heal in 3 to 10 days, with moderate and severe sprains taking 2 to 10 weeks. Your caregiver can help you determine the proper time required for healing. °HOME CARE INSTRUCTIONS  °· Apply ice to the injury for 15-20 minutes, 03-04 times per day. Put the ice in a plastic bag and place a towel between the bag of ice and your skin. °· An elastic wrap (like an Ace bandage) may be used to keep swelling down. °· Keep foot above the level of the heart, or at least raised on a footstool, when   swelling and pain are present. °· Try to avoid use other than gentle range of motion while the foot is painful. Do not resume use until instructed by your caregiver. Then begin use gradually, not increasing use to the point of pain. If pain does develop, decrease use and continue the above measures, gradually increasing activities that do not cause discomfort, until you gradually achieve normal use. °· Use crutches if and as instructed, and for the length of time instructed. °· Keep injured foot and ankle wrapped between treatments. °· Massage foot and ankle for comfort and to keep swelling down. Massage from the toes up towards the knee. °· Only take over-the-counter or prescription medicines for pain, discomfort, or fever as directed by your caregiver. °SEEK IMMEDIATE MEDICAL CARE IF:  °· Your pain and swelling increase, or pain is not controlled with medications. °· You have loss of feeling in your foot or your foot turns cold or  blue. °· You develop new, unexplained symptoms, or an increase of the symptoms that brought you to your caregiver. °MAKE SURE YOU:  °· Understand these instructions. °· Will watch your condition. °· Will get help right away if you are not doing well or get worse. °Document Released: 07/03/2001 Document Revised: 04/05/2011 Document Reviewed: 08/31/2007 °[employer]® Patient Information ©2015 [employer]. This information is not intended to replace advice given to you by your health care provider. Make sure you discuss any questions you have with your health care provider. ° °

## 2014-04-12 ENCOUNTER — Encounter (HOSPITAL_COMMUNITY): Payer: Self-pay | Admitting: *Deleted

## 2014-04-12 ENCOUNTER — Emergency Department (HOSPITAL_COMMUNITY)
Admission: EM | Admit: 2014-04-12 | Discharge: 2014-04-12 | Disposition: A | Discharge status: Home / Self Care | Payer: No Typology Code available for payment source | Attending: Emergency Medicine | Admitting: Emergency Medicine

## 2014-04-12 DIAGNOSIS — Z79899 Other long term (current) drug therapy: Secondary | ICD-10-CM | POA: Insufficient documentation

## 2014-04-12 DIAGNOSIS — K0889 Other specified disorders of teeth and supporting structures: Secondary | ICD-10-CM

## 2014-04-12 DIAGNOSIS — Z720 Tobacco use: Secondary | ICD-10-CM | POA: Insufficient documentation

## 2014-04-12 DIAGNOSIS — K088 Other specified disorders of teeth and supporting structures: Secondary | ICD-10-CM | POA: Insufficient documentation

## 2014-04-12 DIAGNOSIS — Z7982 Long term (current) use of aspirin: Secondary | ICD-10-CM | POA: Diagnosis not present

## 2014-04-12 MED ORDER — IBUPROFEN 800 MG PO TABS: 800.0000 mg | ORAL_TABLET | Freq: Three times a day (TID) | ORAL | Status: AC

## 2014-04-12 NOTE — ED Provider Notes (Signed)
CSN: [id number]     Arrival date & time 04/12/14  2118 History  This chart was scribed for non-physician practitioner Lorre Munroe, PA-C working with Tanna Furry, MD by Hilda Lias, ED Scribe. This patient was seen in room TR06C/TR06C and the patient's care was started at 9:55 PM.     Chief Complaint  Patient presents with  . Dental Pain      The history is provided by the patient. No language interpreter was used.     HPI Comments: Rachel Patel is a 29 y.o. female who presents to the Emergency Department complaining of constant, worsening left lower jaw dental pain on a molar tooth that she is set to have removed in four days. Pt notes she will be seen by an oral surgeon in four days to have 7 teeth removed, but states that this one tooth in particular is causing her the most pain. Pt states that the pain from this tooth radiates down her jaw to her left shoulder and the entire left side of her face and head as well. Pt states that she cannot control her pain with OTC medications, as she states she has been taking ibuprofen regularly with no relief.     Past Medical History  Diagnosis Date  . Asthma     during pregnancy  . Hypertension     during pregnancy  . Pregnancy induced hypertension    Past Surgical History  Procedure Laterality Date  . No past surgeries    . Tubal ligation Bilateral 01/12/2014    Procedure: POST PARTUM TUBAL LIGATION;  Surgeon: Delice Lesch, MD;  Location: [facility];  Service: Gynecology;  Laterality: Bilateral;   Family History  Problem Relation Age of Onset  . Other Neg Hx   . Hypertension Mother     PREECLAMPSIA   History  Substance Use Topics  . Smoking status: Current Every Day Smoker -- 0.50 packs/day    Types: Cigarettes  . Smokeless tobacco: Never Used  . Alcohol Use: No   OB History    Gravida Para Term Preterm AB TAB SAB Ectopic Multiple Living   5 4 3 1 1 1    0 4     Review of Systems  HENT: Positive for dental problem.        Allergies  Codeine  Home Medications   Prior to Admission medications   Medication Sig Start Date End Date Taking? Authorizing Provider  aspirin 325 MG tablet Take 325 mg by mouth daily.   Yes Historical Provider, MD  ibuprofen (ADVIL,MOTRIN) 200 MG tablet Take 200 mg by mouth every 6 (six) hours as needed for moderate pain.   Yes Historical Provider, MD  ibuprofen (ADVIL,MOTRIN) 800 MG tablet Take 1 tablet (800 mg total) by mouth 3 (three) times daily. Patient not taking: Reported on 04/12/2014 02/22/14   Charlann Lange, PA-C  labetalol (NORMODYNE) 100 MG tablet Take 1 tablet (100 mg total) by mouth 2 (two) times daily. 01/13/14   Donnel Saxon, CNM  neomycin-polymyxin-hydrocortisone (CORTISPORIN) 3.5-10000-1 otic suspension Place 4 drops into the right ear 3 (three) times daily. Patient not taking: Reported on 01/08/2014 12/08/13   Gregor Hams, MD  PredniSONE 5 MG KIT 6 day kit po Patient not taking: Reported on 01/08/2014 12/11/12   Gregor Hams, MD   BP 127/97 mmHg  Pulse 91  Temp(Src) 98.2 F (36.8 C) (Oral)  Resp 18  SpO2 98% Physical Exam  Constitutional: She is oriented to person, place, and time.  She appears well-developed and well-nourished.  HENT:  Head: Normocephalic and atraumatic.  Mouth/Throat:    Poor dentition throughout.  Affected tooth as diagrammed.  No signs of peritonsillar or tonsillar abscess.  No signs of gingival abscess. Oropharynx is clear and without exudates.  Uvula is midline.  Airway is intact. No signs of Ludwig's angina with palpation of oral and sublingual mucosa.   Eyes: Conjunctivae and EOM are normal.  Neck: Normal range of motion.  Cardiovascular: Normal rate.   Pulmonary/Chest: Effort normal.  Abdominal: She exhibits no distension.  Musculoskeletal: Normal range of motion.  Neurological: She is alert and oriented to person, place, and time.  Skin: Skin is warm and dry.  Psychiatric: She has a normal mood and affect. Her behavior  is normal. Judgment and thought content normal.  Nursing note and vitals reviewed.   ED Course  Procedures (including critical care time)  DIAGNOSTIC STUDIES: Oxygen Saturation is 98% on RA, normal by my interpretation.    COORDINATION OF CARE: 9:59 PM Discussed treatment plan with pt at bedside and pt agreed to plan.   Labs Review Labs Reviewed - No data to display  Imaging Review No results found.   EKG Interpretation None      MDM   Final diagnoses:  Pain, dental    Patient with toothache.  No gross abscess.  Exam unconcerning for Ludwig's angina or spread of infection.  Patient is under the care of any oral surgeon.  She is scheduled for surgery for this Tuesday.  She requests a prescription of ibuprofen 86m.  I see now evidence of infection.    Urged patient to follow-up with dentist.    I personally performed the services described in this documentation, which was scribed in my presence. The recorded information has been reviewed and is accurate.     RMontine Circle PA-C 04/12/14 2207  MTanna Furry MD 04/13/14 2207

## 2014-04-12 NOTE — ED Notes (Signed)
Pt in c/o toothache, states she is scheduled to have 7 teeth removed on Tuesday but tonight pain is worse and she is unable to control it with home medication, no distress noted

## 2014-04-12 NOTE — Discharge Instructions (Signed)
